# Patient Record
Sex: Female | Born: 1945 | Race: Black or African American | Hispanic: No | Smoking: Never smoker
Health system: Southern US, Community
[De-identification: ages and names within clinical notes are randomized; demographics above are authoritative.]

## PROBLEM LIST (undated history)

## (undated) DIAGNOSIS — I10 Essential (primary) hypertension: Secondary | ICD-10-CM

## (undated) DIAGNOSIS — E785 Hyperlipidemia, unspecified: Secondary | ICD-10-CM

## (undated) HISTORY — DX: Hyperlipidemia, unspecified: E78.5

## (undated) HISTORY — DX: Essential (primary) hypertension: I10

## (undated) HISTORY — PX: HAND SURGERY: SHX662

---

## 1997-12-23 ENCOUNTER — Other Ambulatory Visit: Admission: RE | Admit: 1997-12-23 | Discharge: 1997-12-23 | Payer: Self-pay | Admitting: *Deleted

## 1998-01-28 ENCOUNTER — Ambulatory Visit (HOSPITAL_COMMUNITY): Admission: RE | Admit: 1998-01-28 | Discharge: 1998-01-28 | Payer: Self-pay | Admitting: Family Medicine

## 1998-02-24 ENCOUNTER — Ambulatory Visit (HOSPITAL_COMMUNITY): Admission: RE | Admit: 1998-02-24 | Discharge: 1998-02-24 | Payer: Self-pay | Admitting: Family Medicine

## 1999-03-22 ENCOUNTER — Other Ambulatory Visit: Admission: RE | Admit: 1999-03-22 | Discharge: 1999-03-22 | Payer: Self-pay | Admitting: *Deleted

## 1999-07-11 ENCOUNTER — Other Ambulatory Visit: Admission: RE | Admit: 1999-07-11 | Discharge: 1999-07-11 | Payer: Self-pay | Admitting: *Deleted

## 1999-08-11 ENCOUNTER — Encounter (INDEPENDENT_AMBULATORY_CARE_PROVIDER_SITE_OTHER): Payer: Self-pay | Admitting: Specialist

## 1999-08-11 ENCOUNTER — Other Ambulatory Visit: Admission: RE | Admit: 1999-08-11 | Discharge: 1999-08-11 | Payer: Self-pay | Admitting: *Deleted

## 2000-02-13 ENCOUNTER — Other Ambulatory Visit: Admission: RE | Admit: 2000-02-13 | Discharge: 2000-02-13 | Payer: Self-pay | Admitting: *Deleted

## 2000-03-07 ENCOUNTER — Encounter: Payer: Self-pay | Admitting: *Deleted

## 2000-03-07 ENCOUNTER — Encounter: Admission: RE | Admit: 2000-03-07 | Discharge: 2000-03-07 | Payer: Self-pay | Admitting: *Deleted

## 2000-11-30 ENCOUNTER — Ambulatory Visit (HOSPITAL_BASED_OUTPATIENT_CLINIC_OR_DEPARTMENT_OTHER): Admission: RE | Admit: 2000-11-30 | Discharge: 2000-11-30 | Payer: Self-pay | Admitting: Family Medicine

## 2001-03-11 ENCOUNTER — Encounter: Payer: Self-pay | Admitting: Family Medicine

## 2001-03-11 ENCOUNTER — Ambulatory Visit (HOSPITAL_COMMUNITY): Admission: RE | Admit: 2001-03-11 | Discharge: 2001-03-11 | Payer: Self-pay | Admitting: Family Medicine

## 2001-03-22 ENCOUNTER — Encounter: Admission: RE | Admit: 2001-03-22 | Discharge: 2001-03-22 | Payer: Self-pay | Admitting: Family Medicine

## 2001-03-22 ENCOUNTER — Encounter: Payer: Self-pay | Admitting: Family Medicine

## 2001-10-18 ENCOUNTER — Ambulatory Visit (HOSPITAL_BASED_OUTPATIENT_CLINIC_OR_DEPARTMENT_OTHER): Admission: RE | Admit: 2001-10-18 | Discharge: 2001-10-18 | Payer: Self-pay | Admitting: Family Medicine

## 2002-05-22 ENCOUNTER — Emergency Department (HOSPITAL_COMMUNITY): Admission: EM | Admit: 2002-05-22 | Discharge: 2002-05-22 | Payer: Self-pay | Admitting: Emergency Medicine

## 2002-05-22 ENCOUNTER — Encounter: Payer: Self-pay | Admitting: Emergency Medicine

## 2002-06-29 ENCOUNTER — Emergency Department (HOSPITAL_COMMUNITY): Admission: EM | Admit: 2002-06-29 | Discharge: 2002-06-30 | Payer: Self-pay | Admitting: Emergency Medicine

## 2002-06-29 ENCOUNTER — Encounter: Payer: Self-pay | Admitting: Emergency Medicine

## 2002-08-05 ENCOUNTER — Encounter: Admission: RE | Admit: 2002-08-05 | Discharge: 2002-08-05 | Payer: Self-pay | Admitting: Family Medicine

## 2002-08-05 ENCOUNTER — Encounter: Payer: Self-pay | Admitting: Family Medicine

## 2005-02-03 ENCOUNTER — Ambulatory Visit: Payer: Self-pay | Admitting: Internal Medicine

## 2005-02-06 ENCOUNTER — Ambulatory Visit: Payer: Self-pay | Admitting: Internal Medicine

## 2005-02-16 ENCOUNTER — Ambulatory Visit: Payer: Self-pay | Admitting: *Deleted

## 2006-06-21 ENCOUNTER — Ambulatory Visit: Payer: Self-pay | Admitting: Obstetrics and Gynecology

## 2006-06-21 ENCOUNTER — Encounter: Payer: Self-pay | Admitting: Obstetrics and Gynecology

## 2006-06-26 ENCOUNTER — Ambulatory Visit (HOSPITAL_COMMUNITY): Admission: RE | Admit: 2006-06-26 | Discharge: 2006-06-26 | Payer: Self-pay | Admitting: Obstetrics and Gynecology

## 2008-06-11 ENCOUNTER — Ambulatory Visit (HOSPITAL_COMMUNITY): Admission: RE | Admit: 2008-06-11 | Discharge: 2008-06-11 | Payer: Self-pay | Admitting: Internal Medicine

## 2008-06-11 ENCOUNTER — Encounter: Payer: Self-pay | Admitting: Internal Medicine

## 2008-06-26 ENCOUNTER — Encounter: Admission: RE | Admit: 2008-06-26 | Discharge: 2008-06-26 | Payer: Self-pay | Admitting: Internal Medicine

## 2009-03-02 ENCOUNTER — Encounter: Admission: RE | Admit: 2009-03-02 | Discharge: 2009-05-26 | Payer: Self-pay | Admitting: Family Medicine

## 2009-07-24 HISTORY — PX: OTHER SURGICAL HISTORY: SHX169

## 2009-07-24 HISTORY — PX: COLONOSCOPY: SHX174

## 2009-08-16 ENCOUNTER — Emergency Department (HOSPITAL_COMMUNITY): Admission: EM | Admit: 2009-08-16 | Discharge: 2009-08-16 | Payer: Self-pay | Admitting: Emergency Medicine

## 2009-08-17 ENCOUNTER — Ambulatory Visit: Payer: Self-pay | Admitting: Family Medicine

## 2009-08-17 ENCOUNTER — Observation Stay (HOSPITAL_COMMUNITY): Admission: AD | Admit: 2009-08-17 | Discharge: 2009-08-19 | Payer: Self-pay | Admitting: Family Medicine

## 2009-08-18 ENCOUNTER — Ambulatory Visit: Payer: Self-pay | Admitting: Gastroenterology

## 2009-08-19 ENCOUNTER — Encounter: Payer: Self-pay | Admitting: Gastroenterology

## 2009-08-19 ENCOUNTER — Encounter: Payer: Self-pay | Admitting: Family Medicine

## 2009-08-20 ENCOUNTER — Encounter: Payer: Self-pay | Admitting: Gastroenterology

## 2009-09-14 ENCOUNTER — Ambulatory Visit: Payer: Self-pay | Admitting: Family Medicine

## 2009-09-14 ENCOUNTER — Encounter: Payer: Self-pay | Admitting: Family Medicine

## 2009-09-14 DIAGNOSIS — Z8719 Personal history of other diseases of the digestive system: Secondary | ICD-10-CM | POA: Insufficient documentation

## 2009-09-14 DIAGNOSIS — K621 Rectal polyp: Secondary | ICD-10-CM

## 2009-09-14 DIAGNOSIS — K620 Anal polyp: Secondary | ICD-10-CM | POA: Insufficient documentation

## 2009-09-14 DIAGNOSIS — K219 Gastro-esophageal reflux disease without esophagitis: Secondary | ICD-10-CM | POA: Insufficient documentation

## 2009-09-14 DIAGNOSIS — R7309 Other abnormal glucose: Secondary | ICD-10-CM | POA: Insufficient documentation

## 2009-09-14 DIAGNOSIS — I10 Essential (primary) hypertension: Secondary | ICD-10-CM | POA: Insufficient documentation

## 2009-09-14 LAB — CONVERTED CEMR LAB
Calcium: 9.5 mg/dL (ref 8.4–10.5)
Creatinine, Ser: 0.66 mg/dL (ref 0.40–1.20)
Hgb A1c MFr Bld: 6.1 %
MCHC: 31.5 g/dL (ref 30.0–36.0)
Sodium: 141 meq/L (ref 135–145)

## 2009-09-15 ENCOUNTER — Encounter: Payer: Self-pay | Admitting: Family Medicine

## 2009-10-12 ENCOUNTER — Ambulatory Visit: Payer: Self-pay | Admitting: Family Medicine

## 2009-10-12 ENCOUNTER — Encounter: Payer: Self-pay | Admitting: Family Medicine

## 2009-10-12 LAB — CONVERTED CEMR LAB
ALT: 15 units/L (ref 0–35)
BUN: 11 mg/dL (ref 6–23)
CO2: 25 meq/L (ref 19–32)
Chloride: 105 meq/L (ref 96–112)
Cholesterol: 190 mg/dL (ref 0–200)
Glucose, Bld: 85 mg/dL (ref 70–99)
HDL: 58 mg/dL (ref >39)
LDL Cholesterol: 114 mg/dL — ABNORMAL HIGH (ref 0–99)
Potassium: 3.7 meq/L (ref 3.5–5.3)
Sodium: 141 meq/L (ref 135–145)
Total CHOL/HDL Ratio: 3.3
Triglycerides: 88 mg/dL (ref <150)

## 2009-11-24 ENCOUNTER — Encounter: Payer: Self-pay | Admitting: Family Medicine

## 2009-11-24 ENCOUNTER — Ambulatory Visit: Payer: Self-pay | Admitting: Family Medicine

## 2009-11-24 DIAGNOSIS — E785 Hyperlipidemia, unspecified: Secondary | ICD-10-CM | POA: Insufficient documentation

## 2009-11-24 LAB — CONVERTED CEMR LAB
Hemoglobin: 13.2 g/dL (ref 12.0–15.0)
MCHC: 31.7 g/dL (ref 30.0–36.0)
MCV: 88 fL (ref 78.0–100.0)
RBC: 4.74 M/uL (ref 3.87–5.11)

## 2009-11-25 ENCOUNTER — Encounter: Payer: Self-pay | Admitting: Family Medicine

## 2009-11-28 ENCOUNTER — Encounter: Payer: Self-pay | Admitting: Family Medicine

## 2009-12-07 ENCOUNTER — Telehealth: Payer: Self-pay | Admitting: Family Medicine

## 2010-03-09 ENCOUNTER — Inpatient Hospital Stay (HOSPITAL_COMMUNITY): Admission: EM | Admit: 2010-03-09 | Discharge: 2010-03-12 | Payer: Self-pay | Source: Home / Self Care

## 2010-05-12 ENCOUNTER — Ambulatory Visit: Payer: Self-pay | Admitting: Family Medicine

## 2010-05-12 DIAGNOSIS — I77810 Thoracic aortic ectasia: Secondary | ICD-10-CM | POA: Insufficient documentation

## 2010-05-12 DIAGNOSIS — Z9189 Other specified personal risk factors, not elsewhere classified: Secondary | ICD-10-CM | POA: Insufficient documentation

## 2010-05-24 ENCOUNTER — Encounter: Payer: Self-pay | Admitting: Family Medicine

## 2010-05-24 DIAGNOSIS — T148XXA Other injury of unspecified body region, initial encounter: Secondary | ICD-10-CM | POA: Insufficient documentation

## 2010-05-25 ENCOUNTER — Telehealth: Payer: Self-pay | Admitting: Family Medicine

## 2010-07-11 ENCOUNTER — Encounter (INDEPENDENT_AMBULATORY_CARE_PROVIDER_SITE_OTHER): Payer: Self-pay | Admitting: *Deleted

## 2010-08-01 ENCOUNTER — Ambulatory Visit: Admission: RE | Admit: 2010-08-01 | Discharge: 2010-08-01 | Payer: Self-pay | Source: Home / Self Care

## 2010-08-01 DIAGNOSIS — M545 Low back pain, unspecified: Secondary | ICD-10-CM | POA: Insufficient documentation

## 2010-08-15 ENCOUNTER — Ambulatory Visit: Admission: RE | Admit: 2010-08-15 | Discharge: 2010-08-15 | Payer: Self-pay | Source: Home / Self Care

## 2010-08-15 ENCOUNTER — Encounter: Payer: Self-pay | Admitting: Family Medicine

## 2010-08-15 LAB — CONVERTED CEMR LAB
CO2: 24 meq/L (ref 19–32)
Chloride: 106 meq/L (ref 96–112)
Creatinine, Ser: 0.75 mg/dL (ref 0.40–1.20)
Glucose, Bld: 86 mg/dL (ref 70–99)

## 2010-08-16 ENCOUNTER — Telehealth: Payer: Self-pay | Admitting: *Deleted

## 2010-08-23 ENCOUNTER — Other Ambulatory Visit: Payer: Self-pay | Admitting: General Surgery

## 2010-08-23 DIAGNOSIS — N6310 Unspecified lump in the right breast, unspecified quadrant: Secondary | ICD-10-CM

## 2010-08-25 NOTE — Letter (Signed)
Summary: Patient Notice- Polyp Results  [facility]  [address] [place], [place] [address]   Phone: [phone]  Fax: [phone]        August 20, 2009 MRN: [id number]    [facility] [address] [place], [place]  [address]    Dear Ms. Vicars,  I am pleased to inform you that the colon polyp(s) removed during your recent colonoscopy was (were) found to be benign (no cancer detected) upon pathologic examination. The biopsies showed ischemic colitis.  I recommend you have a repeat colonoscopy examination in 5 years to look for recurrent polyps, as having colon polyps increases your risk for having recurrent polyps or even colon cancer in the future.  Should you develop new or worsening symptoms of abdominal pain, bowel habit changes or bleeding from the rectum or bowels, please schedule an evaluation with either your primary care physician or with me.  Continue treatment plan as outlined the day of your exam.  Please call us if you are having persistent problems or have questions about your condition that have not been fully answered at this time.  Sincerely,  Meryl Dare MD [facility]  This letter has been electronically signed by your physician.  Appended Document: Patient Notice- Polyp Results letter mailed to patient's home
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
## 2010-08-25 NOTE — Miscellaneous (Signed)
Summary: FMLA  Patient left Fmla form to be filled out.  She would like to pick it up by Friday if possible. Bradly Bienenstock  May 24, 2010 4:49 PM  FMLA form placed in Dr. Tyson Alias box for completion.  Terese Door  May 25, 2010 11:11 AM  Formed given to [employer].  Renold Don MD  May 25, 2010 5:06 PM  Pt. called and informed forms are completed and ready to be picked up.  Pt. was very appreciative. Terese Door  May 25, 2010 5:21 PM

## 2010-08-25 NOTE — Letter (Signed)
Summary: Generic Letter  [facility]  [address]   [place], [place] [address]   Phone: [phone]  Fax: [phone]    11/28/2009  [facility] [address] [place], [place]  [address]  Dear Shelly Wheeler,  IT was good to see you in clinic again last week.  The results of your most recent labwork show that your iron levels are good.  We also tested your thyroid function as we discussed and these levels were also normal.  However, your vitamin D level was low.  This could be contributing to your fatigue.  We have a prescription for Vitamin D supplementation to take once weekly for 12 weeks and then once a month for 3 more months.  Please call the clinic and let us know where we can send the prescription. For your records, below are the results of your cholesterol panel:    Triglyceride              88 mg/dL                    <253   HDL Cholesterol           58 mg/dL                    >66   Total Chol/HDL Ratio      3.3 Ratio   VLDL Cholesterol (Calc)                             18 mg/dL                    4-40   LDL Cholesterol (Calc)                        [H]  114 mg/dL                   3-47  As you started you on several more medications for your blood pressure and cholesterol, it would be better to see you in the office in 6 weeks rather than 3 months.  Please call the office to make an appointment at your convenience.    Sincerely,   Renold Don MD  Appended Document: Generic Letter mailed.

## 2010-08-25 NOTE — Assessment & Plan Note (Signed)
Summary: fu/kh   Vital Signs:  Patient profile:   65 year old female Weight:      184.8 pounds Temp:     97.7 degrees F oral Pulse rate:   77 / minute Pulse rhythm:   regular BP sitting:   146 / 101  (left arm) Cuff size:   regular  Vitals Entered By: Loralee Pacas CMA (Nov 24, 2009 3:56 PM)  Primary Care Provider:  Renold Don MD  CC:  follow up visit.  History of Present Illness: CC:  "I'm here to follow up with you after last visit."  1.  Allergic rhinitis:  Patient complaining of increasing nasal drainage for past several months.  She has had a few episodes of ocular itching as well with occasional drainage.  No cough or other respiratory symptoms.  Worsening with change in weather.  Has not been diagnosed with allergies in past.  No hx/o eczema or asthma.  2.  Rash: Patient has student who came to school with poison ivy about 1 week ago.  Has had some itching and red bumps on skin around neck since then.  Using a new type of soap for past month.  Thinks it maight also be from necklaces she wears.  Wants to make sure she doesn't have poison ivy.  3.  HTN:  Pressures elevated this visit, bp 150/100 last visit.  Was given Norvasc at last visit.  Has been taking regularly everyday.  Concerned about pressures and wants to know if HTN contributed to her ischemic colitis and previous hospital admission.  States she is eating a better diet and has started exercising regularly.  Cut down on sodas and tea, but still eats plenty of ice cream 3-5 x weekly.    ROS:  no chest pain, trouble breathing, change in bowel habits, bloody stools, syncope or pre-syncope  Current Problems (verified): 1)  Contact Dermatitis  (ICD-692.9) 2)  Allergic Rhinitis  (ICD-477.9) 3)  Fatigue  (ICD-780.79) 4)  Colonic Polyps, Adenomatous, Benign  (ICD-569.0) 5)  Ischemic Colitis, Hx of  (ICD-V12.79) 6)  Gastroesophageal Reflux Disease  (ICD-530.81) 7)  Hyperglycemia  (ICD-790.29) 8)  Essential Hypertension   (ICD-401.9)  Current Medications (verified): 1)  Amlodipine Besylate 5 Mg Tabs (Amlodipine Besylate) .... Take 1 Daily 2)  Famotidine 20 Mg Tabs (Famotidine) .... Take 1 Daily As Needed For Reflux 3)  Lovastatin 10 Mg Tabs (Lovastatin) .... Take 1 Pill Daily For High Cholesterol 4)  Hydrochlorothiazide 25 Mg Tabs (Hydrochlorothiazide) .... Take 1 Pill Daily For High Blood Pressure  Allergies (verified): No Known Drug Allergies  Past History:  Past medical, surgical, family and social histories (including risk factors) reviewed, and no changes noted (except as noted below).  Past Medical History: Reviewed history from 09/14/2009 and no changes required. Sees a nutritionist on recommendation from Urgent Care physician after being diagnosed "pre-diabetic" Hospitalized Jan 2010 for BRBPR - found to have ischemic colitis on colonscopy Adenomatous polyps removed Jan 2010 - repeat colonscope in 5 years  Past Surgical History: Reviewed history from 09/14/2009 and no changes required. denies  Family History: Reviewed history from 09/14/2009 and no changes required. Father:prostate CA Brother:  Bone CA Mother:  HTN Brother:  HTN Aunt, brother:  Diabetes Aunt with breast CA.  No history GI CA  Social History: Reviewed history from 09/14/2009 and no changes required. 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  Divorced 1980. Starting a walking  exercise program.      Smoking:  never EtOH:  none Drugs of abuse:  none  Review of Systems       see HPI  Physical Exam  General:  Vital signs reviewed Well-developed, well-nourished patient in NAD.  Awake, cooperative.  Eyes:  pupils equal, pupils round, and pupils reactive to light.   Mouth:  oral mucosa moist Neck:  no goiter Lungs:  clear to auscultation without wheezing Heart:  RRR without murmurs Abdomen:  soft/NT/ND, normal bowel sounds Pulses:  distal  pulses palpable BL extremities Extremities:  no edema noted  Skin:  very small, <1 mm, erythematous, papules scattered across upper chest in U-shaped pattern around neck.  No vesicles noted.     Impression & Recommendations:  Problem # 1:  ALLERGIC RHINITIS (ICD-477.9) Assessment New Feel this is most likely allergic rhinitis.  No signs or symptom of upper respiratory illness, just rhinitis aggravated by changes in weather.  Discussed treatment options with patient, plan to try OTC Claritin or Allegra at this time.  Pt may need inhaled nasal steroid in future, but for now will try OTC H-blocker.   Orders: [facility]- Est  Level 4 ([address])  Problem # 2:  CONTACT DERMATITIS (ICD-692.9) Assessment: New Patient does not have poison ivy.  Feel this is most likely contact dermatitis.  Erythematous areas conforms to possible necklace or sweater pattern. Does endorse history of starting new soap recently, cheaper brand.  Recommended she switch to another or previous brand.  H1-blocker from above may also help with this.  Recommended trying Benadryl for relief and also not wearing necklaces for a while to see if that caused dermatitis.   Orders: [facility]- Est  Level 4 ([address]29)  Problem # 3:  ESSENTIAL HYPERTENSION (ICD-401.9) Assessment: Improved Added HCTZ to regimen.  Patient still hypertensive, but improved.  Recommended she continue with diet and exercise changes.  Will continue to follow blood pressure.  Follow-up in 3 months to recheck  Her updated medication list for this problem includes:    Amlodipine Besylate 5 Mg Tabs (Amlodipine besylate) .Marland Kitchen... Take 1 daily    Hydrochlorothiazide 25 Mg Tabs (Hydrochlorothiazide) .Marland Kitchen... Take 1 pill daily for high blood pressure  Problem # 4:  HYPERLIPIDEMIA (ICD-272.4) Plan to start patient on Lovastatin 10 mg.  History of cardiomyopathy per patient.  Do not have records of this as from outside hospital.  As most common cause of cardiomyopathy in [place] is ischemic  cardiomyopathy, so most likely patient has atherosclerosis.  Will try and get patient's lipids under 100.  Plan to check CMET after starting statin.   Her updated medication list for this problem includes:    Lovastatin 10 Mg Tabs (Lovastatin) .Marland Kitchen... Take 1 pill daily for high cholesterol  Problem # 5:  FATIGUE (ICD-780.79) Patient complained of fatigue.  Will check CBC, TSH, Vit D.  Also check CBC to make sure patient not losing any more blood.  Denies any melena or hematochezia.   Orders: Vit D, 25 [facility] [phone]) CBC-[facility] ([address]) TSH-[facility] [phone]) [facility]- Est  Level 4 ([address])  Complete Medication List: 1)  Amlodipine Besylate 5 Mg Tabs (Amlodipine besylate) .... Take 1 daily 2)  Famotidine 20 Mg Tabs (Famotidine) .... Take 1 daily as needed for reflux 3)  Lovastatin 10 Mg Tabs (Lovastatin) .... Take 1 pill daily for high cholesterol 4)  Hydrochlorothiazide 25 Mg Tabs (Hydrochlorothiazide) .... Take 1 pill daily for high blood pressure  Patient Instructions: 1)  Come back and see me  in 3 months.  We will recheck your blood sugars at that time to see how the diet and exercise worked. 2)  For blood pressure, we will add HCTZ 25 mg 1 pill daily.  We will recheck your blood pressures in 3 months as well. 3)  Your colonoscopy results showed two things:  1) ischemic colitis - this means you had some inflammation in your large intestine caused by decreased blood flow.  This is something to keep an eye on but not really to worry about.  2)  Polyps - these are small growths in your colon.  You will need a follow-up colonscopy in 5 years.  These growths were benign. 4)  We will get a few more lab tests today to check reasons for fatigue.  I will send you the results. 5)  For sinus drainage, try Loratadine (Claritin) or Fexofenadine (Allegra) whichever is cheaper.  This should help with the allergies. Prescriptions: HYDROCHLOROTHIAZIDE 25 MG TABS (HYDROCHLOROTHIAZIDE) Take 1 pill daily for high  blood pressure  #31 x 3   Entered and Authorized by:   Renold Don MD   Signed by:   Renold Don MD on 11/24/2009   Method used:   Electronically to        [facility] # [phone]* (retail)       [address] [place], [place]  [address]       Ph: [phone]       Fax: [phone]   RxID:   [id number] LOVASTATIN 10 MG TABS (LOVASTATIN) Take 1 pill daily for high cholesterol  #31 x 3   Entered and Authorized by:   Renold Don MD   Signed by:   Renold Don MD on 11/24/2009   Method used:   Electronically to        [facility] # [phone]* (retail)       [address] [place], [place]  [address]       Ph: [phone]       Fax: [phone]   RxID:   [id number]

## 2010-08-25 NOTE — Miscellaneous (Signed)
  Clinical Lists Changes  Medications: Added new medication of VITAMIN D (ERGOCALCIFEROL) 50000 UNIT CAPS (ERGOCALCIFEROL) Take 1 pill each week for 12 weeks, then take 1 pill a month for 3 more months - Signed Rx of VITAMIN D (ERGOCALCIFEROL) 50000 UNIT CAPS (ERGOCALCIFEROL) Take 1 pill each week for 12 weeks, then take 1 pill a month for 3 more months;  #15 x 0;  Signed;  Entered by: Renold Don MD;  Authorized by: Renold Don MD;  Method used: Electronically to [facility]*, [address] [place], [place], [place]  [address], Ph: [phone], Fax: [phone]    Prescriptions: VITAMIN D (ERGOCALCIFEROL) 50000 UNIT CAPS (ERGOCALCIFEROL) Take 1 pill each week for 12 weeks, then take 1 pill a month for 3 more months  #15 x 0   Entered and Authorized by:   Renold Don MD   Signed by:   Renold Don MD on 12/05/2009   Method used:   Electronically to        [facility]* (retail)       [address] [place], [place]  [address]       Ph: [phone]       Fax: [phone]   RxID:   [id number]

## 2010-08-25 NOTE — Letter (Signed)
Summary: Generic Letter  [facility]  [address]   [place], [place] [address]   Phone: [phone]  Fax: [phone]    09/15/2009  [facility] [address] [place], [place]  [address]  Dear Ms. Capps,  It was good to see you in clinic and we welcome you to our practice.  The bloodwork which we completed at your last visit all was normal.  We were specifically looking at your hemoglobin to make sure you did not have any microscopic bleeding and at your potassium levels.  Both of these were fine.  Your kidney function also was good, which is something we check in people who have a history of high blood pressure.  We will see you again at your next visit.    Sincerely,   Renold Don MD

## 2010-08-25 NOTE — Progress Notes (Signed)
Summary: Rx Req  Phone Note Call from Patient Call back at Home Phone [phone]   Caller: Patient Summary of Call: Pt wants the rx for Vitamin D to go to [facility]. Initial call taken by: Clydell Hakim,  Dec 07, 2009 3:41 PM    Prescriptions: VITAMIN D (ERGOCALCIFEROL) 50000 UNIT CAPS (ERGOCALCIFEROL) Take 1 pill each week for 12 weeks, then take 1 pill a month for 3 more months  #15 x 0   Entered and Authorized by:   Renold Don MD   Signed by:   Renold Don MD on 12/07/2009   Method used:   Electronically to        [facility] # [phone]* (retail)       [address] [place], [place]  [address]       Ph: [phone]       Fax: [phone]   RxID:   [id number]   Appended Document: Rx Req Sent in to [facility].  THanks!

## 2010-08-25 NOTE — Assessment & Plan Note (Signed)
Summary: NP, tcb   Vital Signs:  Patient profile:   65 year old female Height:      64.5 inches Weight:      186.6 pounds BMI:     31.65 Pulse rate:   74 / minute BP sitting:   150 / 100  (right arm) Cuff size:   large  Vitals Entered By: Arlyss Repress CMA, (September 14, 2009 8:56 AM) CC: new pt. hospital f/up. did not take her HTN meds. started again last night. re-checked BP 152/102 Pain Assessment Patient in pain? no        Primary Care Provider:  Renold Don MD  CC:  new pt. hospital f/up. did not take her HTN meds. started again last night. re-checked BP 152/102.  History of Present Illness: CC:  hospital f/u  Problems:  1.  Hospitalization for BRBPR:  no more symptoms.  Colonoscopy in-house showed ischemic colitis and benign polyps, which were removed.  No nausea or vomiting.  Pt received letter regarding results of colonscopy, did not really understand what letter said.  2.  HTN:  blood pressures 150s systolic x 2 in clinic.  Pt has been out of blood pressure medication for past week.  Just restarted last night.  HCTZ D/C'ed in house secondary to hypokalemia.  States she has felt a little more tired than usual, but denies any other symptoms.    3.  GERD:  relieved with Famotidine.  She takes this once daily before breakfast.  Unsure what happens if she doesn't take medicine as she has been taking it every day.  Unsure exactly what triggers her GERD symptoms.  No hematemesis.      ROS:  no pre-syncope or syncopal episodes.  No headaches or vision changes.  No abdominal pain.  No edema.  No rashes  Habits & Providers  Alcohol-Tobacco-Diet     Tobacco Status: never  Current Problems (verified): 1)  Gastroesophageal Reflux Disease  (ICD-530.81) 2)  Hyperglycemia  (ICD-790.29) 3)  Essential Hypertension  (ICD-401.9)  Current Medications (verified): 1)  Amlodipine Besylate 5 Mg Tabs (Amlodipine Besylate) .... Take 1 Daily 2)  Metformin Hcl 500 Mg Tabs (Metformin  Hcl) .... Take 1 Daily 3)  Famotidine 20 Mg Tabs (Famotidine) .... Take 1 Daily As Needed For Reflux  Allergies (verified): No Known Drug Allergies  Past History:  Past Medical History: Sees a nutritionist on recommendation from Urgent Care physician after being diagnosed "pre-diabetic" Hospitalized Jan 2010 for BRBPR - found to have ischemic colitis on colonscopy Adenomatous polyps removed Jan 2010 - repeat colonscope in 5 years  Past Surgical History: denies  Family History: Father:prostate CA Brother:  Bone CA Mother:  HTN Brother:  HTN Aunt, brother:  Diabetes Aunt with breast CA.  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 Starting a walking exercise program.      Smoking:  never EtOH:  none Drugs of abuse:  noneSmoking Status:  never  Physical Exam  General:  Vital signs reviewed Gen Eyes:  No corneal or conjunctival inflammation noted. EOMI. Perrla. Funduscopic exam benign, without hemorrhages, exudates or papilledema. Vision grossly normal. Ears:  External ear exam shows no significant lesions or deformities.  Otoscopic examination reveals clear canals, tympanic membranes are intact bilaterally without bulging, retraction, inflammation or discharge. Hearing is grossly normal bilaterally. Mouth:  oral mucosa moist, tonsils non-erythematous Neck:  no lymphadenopathy Lungs:  clear to auscultation without wheezing Heart:  RRR without murmurs Abdomen:  soft/NT/ND, normal bowel sounds Pulses:  distal pulses palpable BL extremities Extremities:  no edema noted    Impression & Recommendations:  Problem # 1:  ISCHEMIC COLITIS, HX OF (ICD-V12.79) No further symptoms.  Not on any blood thinners.  Followed by [facility].  Will continue with supportive management as pt is asymptomatic.  Problem # 2:  COLONIC POLYPS, ADENOMATOUS, BENIGN (ICD-569.0) Needs  f/u colonoscope in 5 years.  Previously performed by [facility].  Removed 2 polyps in total.  Will follow GI rec's.    Problem # 3:  ESSENTIAL HYPERTENSION (ICD-401.9) Pt previously on HCTZ 25 mg as well as Amlodipine 5 mg.  Stopped at last hospitalization secondary to hypokalemia.  Pressures 150s/100s x 2 measurements in clinic today.  However, she has been out of her medications for past week.  Plan to allow 1 month on Norvasc, then return to clinic for recheck.  Will check BMET today.  May need to restart HCTZ next visit depending on potassium level as well as blood pressures at next visit.   Her updated medication list for this problem includes:    Amlodipine Besylate 5 Mg Tabs (Amlodipine besylate) .Marland Kitchen... Take 1 daily  Orders: Basic Met-[facility] ([address]) CBC-[facility] ([address])  Problem # 4:  HYPERGLYCEMIA (ICD-790.29) Diagnosed with "pre-diabetes" at previous PCP.  Started on Metformin by previous PCP.  Plan to check A1C today as well as random glucose on BMET.  Depending on results, may need to stop or increase Metformin.  Should also check CMET for liver status at next visit if she's going to stay on Metformin.   Her updated medication list for this problem includes:    Metformin Hcl 500 Mg Tabs (Metformin hcl) .Marland Kitchen... Take 1 daily  Orders: A1C-[facility] ([address])  Problem # 5:  GASTROESOPHAGEAL REFLUX DISEASE (ICD-530.81) Pt was on PPI prior to being seen by GI.  They stopped PPI in house and started her on Famotidine.  Discussed with patient that we can move H2 blocker to as needed basis.  Will follow-up with tolerance of this at next visit.  Now that BRBPR has stopped, can restart PPI if necessary.   Her updated medication list for this problem includes:    Famotidine 20 Mg Tabs (Famotidine) .Marland Kitchen... Take 1 daily as needed for reflux  Complete Medication List: 1)  Amlodipine Besylate 5 Mg Tabs (Amlodipine besylate) .... Take 1 daily 2)  Metformin Hcl 500 Mg Tabs (Metformin hcl) .... Take 1 daily 3)   Famotidine 20 Mg Tabs (Famotidine) .... Take 1 daily as needed for reflux  Patient Instructions: 1)  Come back and see me in 1 month so we can recheck your blood pressure.  Keep taking the Amlodipine for your blood pressure. 2)  On your way out, set up a lab appt to have your cholesterol checked at your convenience.  You cannot eat for 8 hours beforehand. 3)  Your colonoscopy results showed two things:  1) ischemic colitis - this means you had some inflammation in your large intestine caused by decreased blood flow.  This is something to keep an eye on but not really to worry about.  2)  Polyps - these are small growths in your colon.  You will need a follow-up colonscopy in 5 years.  These growths were benign. 4)  Take the Famotidine (acid reducing medicine) on an as needed basis for reflux. 5)  We will get a few lab tests  today.  At your next appt, we will go over these and see about starting another blood pressure medicine.   Prescriptions: FAMOTIDINE 20 MG TABS (FAMOTIDINE) Take 1 daily as needed for reflux  #30 x 3   Entered and Authorized by:   Renold Don MD   Signed by:   Bonnie Swaziland on 09/14/2009   Method used:   Electronically to        [facility] # [phone]* (retail)       [address] [place], [place]  [address]       Ph: [phone]       Fax: [phone]   RxID:   [id number] METFORMIN HCL 500 MG TABS (METFORMIN HCL) Take 1 daily  #30 x 1   Entered and Authorized by:   Renold Don MD   Signed by:   Bonnie Swaziland on 09/14/2009   Method used:   Electronically to        [facility] # [phone]* (retail)       [address] [place], [place]  [address]       Ph: [phone]       Fax: [phone]   RxID:   [id number] AMLODIPINE BESYLATE 5 MG TABS (AMLODIPINE BESYLATE) Take 1 daily  #30 x 3   Entered and Authorized by:   Renold Don MD   Signed by:   Bonnie Swaziland on 09/14/2009   Method used:   Electronically to        [facility] #  [phone]* (retail)       [address] [place], [place]  [address]       Ph: [phone]       Fax: [phone]   RxID:   [id number]    Flowsheet View for Follow-up Visit    Weight:     186.6    Blood pressure:   150 / 100   Laboratory Results   Blood Tests   Date/Time Received: September 14, 2009 9:45 AM  Date/Time Reported: September 14, 2009 11:08 AM   HGBA1C: 6.1%   (Normal Range: Non-Diabetic - 3-6%   Control Diabetic - 6-8%)  Comments: ...............test performed by......Marland KitchenBonnie A. Swaziland, MLS (ASCP)cm

## 2010-08-25 NOTE — Miscellaneous (Signed)
Summary: sent medical records   Clinical Lists Changes Send medical records to [employer] 02/2010 to present. [employer]

## 2010-08-25 NOTE — Procedures (Signed)
Summary: Colonoscopy  Patient: [name] Note: All result statuses are Final unless otherwise noted.  Tests: (1) Colonoscopy (COL)   COL Colonoscopy           DONE     [facility]     [address]     [place], [place]  [address]           COLONOSCOPY PROCEDURE REPORT           PATIENT:  Shelly, Wheeler  MR#:  [id number]     BIRTHDATE:  03-22-1946, 63 yrs. old  GENDER:  female           ENDOSCOPIST:  Judie Petit T. Russella Dar, MD, [facility]     Referred by:  Family Medicine Teaching Service           PROCEDURE DATE:  08/19/2009     PROCEDURE:  Colonoscopy with biopsy and snare polypectomy     ASA CLASS:  Class II     INDICATIONS:  1) hematochezia  2) abnormal CT of abdomen           MEDICATIONS:   Fentanyl 100 mcg IV, Versed 8 mg IV           DESCRIPTION OF PROCEDURE:   After the risks benefits and     alternatives of the procedure were thoroughly explained, informed     consent was obtained.  Digital rectal exam was performed and     revealed no abnormalities.   The [id number] ([id number]) endoscope was     introduced through the anus and advanced to the cecum, which was     identified by both the appendix and ileocecal valve, without     limitations.  The quality of the prep was good, using MiraLax.     The instrument was then slowly withdrawn as the colon was fully     examined.     <<PROCEDUREIMAGES>>           FINDINGS:  Colitis was found in the descending colon. It was     segmental, erosive, edematous and circumferential. Multiple     biopsies were obtained and sent to pathology.  Two polyps were     found in the ascending colon. They were 4 - 5 mm in size. Polyps     were snared without cautery. Retrieval was successful. This was     otherwise a normal examination of the colon. Retroflexed views in     the rectum revealed no abnormalities.  The time to cecum =  2     minutes. The scope was then withdrawn (time =  12  min) from the     patient and the procedure  completed.           COMPLICATIONS:  None           ENDOSCOPIC IMPRESSION:     1) Segmental colitis in the descending colon     2) 4 - 5 mm Two polyps in the ascending colon           RECOMMENDATIONS:     1) Await pathology results     2) If the polyps removed today are adenomatous (pre-cancerous),     you will need a repeat colonoscopy in 5 years. Otherwise you     should continue to follow colorectal cancer screening guidelines     for "routine risk" patients with colonoscopy in 10 years.     Venita Lick. Russella Dar, MD, [facility]  CC:  Tracey Harries, MD           n.     Rosalie DoctorVenita Lick. Sarahelizabeth Conway at 08/19/2009 01:32 PM           Stacie Glaze, [id number]  Note: An exclamation mark (!) indicates a result that was not dispersed into the flowsheet. Document Creation Date: 08/19/2009 1:33 PM _______________________________________________________________________  (1) Order result status: Final Collection or observation date-time: 08/19/2009 13:26 Requested date-time:  Receipt date-time:  Reported date-time:  Referring Physician:   Ordering Physician: Claudette Head [id number]) Specimen Source:  Source: Launa Grill Order Number: [id number] Lab site:

## 2010-08-25 NOTE — Progress Notes (Signed)
Summary: phn msg  Phone Note From Other Clinic   Caller: [facility]janice Summary of Call: appt sched for 08/23/10 @ 1045 w/ Jimmye Norman Initial call taken by: De Nurse,  August 16, 2010 2:30 PM  Follow-up for Phone Call        lvm for pt Follow-up by: Loralee Pacas CMA,  August 17, 2010 3:28 PM

## 2010-08-25 NOTE — Progress Notes (Signed)
  Phone Note Call from Patient   Caller: Patient Call For: [phone] Summary of Call: Left forms for physician to complete for employer.  Need to turn them back in to the benefits dept by Friday.  Please call and let me know when they can be picked up. Initial call taken by: Abundio Miu,  May 25, 2010 3:14 PM  Follow-up for Phone Call        Form given to Terese Door today.    Call patient and informed her FMLA forms were completed and ready to be picked up...Marland KitchenMarland KitchenMarland Kitchen Terese Door  May 25, 2010 5:19 PM  Follow-up by: Renold Don MD,  May 25, 2010 5:06 PM

## 2010-08-31 ENCOUNTER — Ambulatory Visit
Admission: RE | Admit: 2010-08-31 | Discharge: 2010-08-31 | Disposition: A | Discharge status: Home / Self Care | Payer: BC Managed Care – PPO | Source: Ambulatory Visit | Attending: General Surgery | Admitting: General Surgery

## 2010-08-31 DIAGNOSIS — N6310 Unspecified lump in the right breast, unspecified quadrant: Secondary | ICD-10-CM

## 2010-09-02 ENCOUNTER — Encounter: Payer: Self-pay | Admitting: *Deleted

## 2010-09-12 ENCOUNTER — Ambulatory Visit (INDEPENDENT_AMBULATORY_CARE_PROVIDER_SITE_OTHER): Payer: BC Managed Care – PPO | Admitting: Family Medicine

## 2010-09-12 ENCOUNTER — Encounter: Payer: Self-pay | Admitting: Family Medicine

## 2010-09-12 VITALS — BP 150/90 | Temp 98.0°F | Ht 64.5 in | Wt 196.0 lb

## 2010-09-12 DIAGNOSIS — I10 Essential (primary) hypertension: Secondary | ICD-10-CM

## 2010-09-12 DIAGNOSIS — J329 Chronic sinusitis, unspecified: Secondary | ICD-10-CM

## 2010-09-12 NOTE — Assessment & Plan Note (Addendum)
Symptoms of congestion and headache with some mild facial tenderness on exam.  Likely viral sinusitis.  Pt has not been taking adequate analgesics (took ibuprofen 200mg ).  Gave correct dose to help with headache (400-600mg ), pt may continue with Coricidin since it has been helping her symptoms.  Red flags given for rtc (Fever >102 that does not respond to antipyrectic, intractable vomiting).  Pt to rtc in 1 wk if symptoms still present and may need antibiotic at that time.  Pt agreeable to plan.

## 2010-09-12 NOTE — Patient Instructions (Signed)
Your symptoms may a week to resolve.  If you are still feeling bad after 7-10 days of symptoms, then come back to clinic.  You may need antibiotic at that time.  You can take Ibuprofen/Advil 400mg  -600mg  (2-3 tabs) 4 times per day for pain or fever or headache.    Sinusitis   Sinuses are air pockets in your face. Sinusitis is an infection in your sinuses. The growth of bacteria in a sinus can lead to infection. The infection can cause swelling from your sinuses.  The infection can cause drainage from your sinuses. Sinusitis can be in one or more of your sinuses.   HOME CARE    Only take medicines for pain, discomfort, or fever as directed by your doctor.  Drink enough water and fluids to keep your pee clear or pale yellow.  Apply either moist heat or ice packs for pain.  Use saline nasal sprays. These will moisten thick fluid in your nose. This can help your sinuses drain. The sprays can be found at a drugstore.   TREATMENT   After an exam, your doctor may give you:   Medicine (antibiotics) that kills the infection.   Medicine (decongestant) to shrink swelling.   GET HELP RIGHT AWAY IF:    You have or your child has a temperature by mouth above 102 F (38.9 C).  You have pain that is getting worse.  You get a very bad headache.  You are throwing up over and over.  You develop swelling on the face.   MAKE SURE YOU:     Understand these instructions.   Will watch your condition.  Will get help right away if you are not doing well or get worse.   Document Released: 12/27/2007  Document Re-Released: 08/01/2009 [facility] Patient Information 2011 [place], [place].

## 2010-09-12 NOTE — Assessment & Plan Note (Signed)
Comment only.  BP elevated today 150/90. Pt states she is taking her meds are directed by Dr Gwendolyn Grant.  Elevation may be 2/2 current sick symptoms (sinusitis).  Advised pt to rtc to see Dr Gwendolyn Grant to address BP.

## 2010-09-12 NOTE — Progress Notes (Signed)
  Subjective:    Patient ID: Shelly Wheeler, female    DOB: 1945/12/05, 65 y.o.   MRN: [id number]  HPI 65 y/o F here for "head congestion" that started on Thurs (2/16) and worsened on Fri (2/17).  She has been taking Coricidin since Saturday (2/18), which seems to help cough.  She complaints of headache that is "achy" and worse on Left side of head.  She used to have migraine headache years ago, but it stopped, but this does not feel like it.  She has taken Advil (200mg ) this morning, which helped a little.  Headache is located in frontal area, above both eyes.     Review of Systems + nasal congestion, +rhinorrhe (which has resolved), subjective fever on Sat and Sun and early this morning, no ear pain/pressure, no nausea/vomiting, no vision change, no watery eyes,no dizziness, no chest pain, no dyspnea, no palpitations, no LE edema.  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    Objective:   Physical Exam GEN: NAD, appropriate and cooperative throughout exam HEENT: MMM, EOMI, no neck nodes or masses. Tenderness to maxillary sinuses. CVS: RRR, no murmurs RESP: CTA b/l no wheezing/rales ABD: NT/ND EXT: no edema       Assessment & Plan:

## 2010-09-20 NOTE — Letter (Signed)
Summary: Out of Work  [facility]  [address]   [place], [place] [address]   Phone: [phone]  Fax: [phone]    September 12, 2010   Employee:  [employer]    To Whom It May Concern:   For Medical reasons, please excuse the above named employee from work for the following dates:  Start:   Sep 12, 2010  End:   May return to work Sep 13, 2010  If you need additional information, please feel free to contact our office.         Sincerely,    Blake Vetrano MD

## 2010-10-01 ENCOUNTER — Other Ambulatory Visit: Payer: Self-pay | Admitting: Family Medicine

## 2010-10-01 DIAGNOSIS — I10 Essential (primary) hypertension: Secondary | ICD-10-CM

## 2010-10-02 NOTE — Telephone Encounter (Signed)
Refill request

## 2010-10-06 ENCOUNTER — Encounter: Payer: Self-pay | Admitting: Family Medicine

## 2010-10-06 ENCOUNTER — Ambulatory Visit (INDEPENDENT_AMBULATORY_CARE_PROVIDER_SITE_OTHER): Payer: BC Managed Care – PPO | Admitting: Family Medicine

## 2010-10-06 DIAGNOSIS — I10 Essential (primary) hypertension: Secondary | ICD-10-CM

## 2010-10-06 DIAGNOSIS — G5700 Lesion of sciatic nerve, unspecified lower limb: Secondary | ICD-10-CM

## 2010-10-06 DIAGNOSIS — Z9189 Other specified personal risk factors, not elsewhere classified: Secondary | ICD-10-CM

## 2010-10-06 MED ORDER — LISINOPRIL-HYDROCHLOROTHIAZIDE 10-12.5 MG PO TABS: 1.0000 | ORAL_TABLET | Freq: Every day | ORAL | Status: DC

## 2010-10-06 NOTE — Patient Instructions (Addendum)
Make an appointment to come back and see me in 1 month.  We will check your labs at that visit.  If you start having a lot of swelling in your legs, headaches, or other concerns call our office.    Piriformis Syndrome with Rehab   Piriformis syndrome is a condition the affects the nervous system in the area of the hip, and is characterized by pain and possibly a loss of feeling in the backside (posterior) thigh that may extend down the entire length of the leg. The symptoms are caused by an increase in pressure on the sciatic nerve by the piriformis muscle, which is on the back of the hip and is responsible for externally rotating the hip. The sciatic nerve and its branches connect to much of the leg. Normally the sciatic nerve runs between the piriformis muscle and other muscles. However, in certain individuals the nerve runs through the muscle, which causes an increase in pressure on the nerve and results in the symptoms of piriformis syndrome.   SYMPTOMS  Pain, tingling, numbness, or burning in the back of the thigh that may also extend down the entire leg.  Occasionally, tenderness in the buttock.  Loss of function of the leg.  Pain that worsens when using the piriformis muscle (running, jumping, or stairs).  Pain that increases with prolonged sitting.  Pain that is lessened by laying flat on the back.   CAUSES  Piriformis syndrome is the result of an increase in pressure placed on the sciatic nerve. Often times piriformis syndrome is an overuse injury.    Stress placed on the nerve from a sudden increase in the intensity, frequency, or duration of training.  Compensation of other extremity injuries.     RISK INCREASES WITH    Sports that involve the piriformis muscle (running, walking or jumping).  You are born with (congenital) a defect in which the sciatic nerve passes through the muscle.   PREVENTIVE MEASURES    Warm up and stretch properly before activity.  Allow for  adequate recovery between workouts.  Maintain physical fitness: l Strength, flexibility, and endurance. l Cardiovascular fitness.   PROGNOSIS If treated properly, then the symptoms of piriformis syndrome usually resolve in 2 to 6 weeks.   POSSIBLE COMPLICATIONS    Persistent and possibly permanent pain and numbness in the lower extremity.  Weakness of the extremity that may progress to disability and inability to compete.   GENERAL TREATMENT CONSIDERATIONS   The most effective treatment for piriformis syndrome is rest from any activities that aggravate the symptoms.  Ice and pain medication may help reduce pain and inflammation. The use of strengthening and stretching exercises may help reduce pain with activity. These exercises may be performed at home or with a therapist.  A referral to a therapist may be given for further evaluation and treatment, such as ultrasound. Corticosteroid injections may be given to reduce inflammation that is causing pressure to be placed on the sciatic nerve. If  non-surgical (conservative) treatment is unsuccessful, then surgery may be recommended.     MEDICATION    If pain medication is necessary, then nonsteroidal anti-inflammatory medications, such as aspirin and ibuprofen, or other minor pain relievers, such as acetaminophen, are often recommended.    Do not take pain medication for 7 days before surgery.    Prescription pain relievers may be given if deemed necessary by your caregiver. Use only as directed and only as much as you need.  Corticosteroid injections may be  given by your caregiver. These injections should be reserved for the most serious cases, because they may only be given a certain number of times.   HEAT AND COLD:    Cold treatment (icing) relieves pain and reduces inflammation. Cold treatment should be applied for 10 to 15 minutes every 2 to 3 hours for inflammation and pain and immediately after any activity that aggravates your  symptoms. Use ice packs or massage the area with a piece of ice (ice massage).  Heat treatment may be used prior to performing the stretching and strengthening activities prescribed by your caregiver, physical therapist, or athletic trainer. Use a heat pack or soak the injury in warm water.   SEEK TREATMENT IF:  Treatment seems to offer no benefit, or the condition worsens.  Any medications produce adverse side effects.     EXERCISES   RANGE OF MOTION AND STRETCHING EXERCISES - Piriformis Syndrome These exercises may help you when beginning to rehabilitate your injury. Your symptoms may resolve with or without further involvement from your physician, physical therapist or athletic trainer. While completing these exercises, remember:      Restoring tissue flexibility helps normal motion to return to the joints. This allows healthier, less painful movement and activity.  An effective stretch should be held for at least 30 seconds.  A stretch should never be painful. You should only feel a gentle lengthening or release in the stretched tissue.      STRETCH - Hip Rotators   Lie on your back on a firm surface. Grasp your __________ knee with your __________ hand and your ankle with your opposite hand.    Keeping your hips and shoulders firmly planted, gently pull your __________ knee and rotate your lower leg toward your opposite shoulder until you feel a stretch in your buttocks.    Hold this stretch for __________ seconds. Repeat this stretch __________ times. Complete this stretch __________ times per day.     STRETCH - Iliotibial Band    On the floor or bed, lie on your side so your __________ leg is on top. Bend your knee and grab your ankle.    Slowly bring your knee back so that your thigh is in line with your trunk.  Keep your heel at your buttocks and gently arch your back so your head, shoulders and hips line up.    Slowly lower your leg so that your knee approaches the  floor/bed until you feel a gentle stretch on the outside of your __________ thigh. If you do not feel a stretch and your knee will not fall farther, place the heel of your opposite foot on top of your knee and pull your thigh down farther.  Hold this stretch for __________ seconds.   Repeat __________ times. Complete __________ times per day.     STRENGTHENING EXERCISES - Piriformis Syndrome  These are some of the caregiver again or until your symptoms are resolved. Remember:    Strong muscles with good endurance tolerate stress better.  Do the exercises as initially prescribed by your caregiver. Progress slowly with each exercise, gradually increasing the number of repetitions and weight used under their guidance.    STRENGTH - Hip Abductors, Straight Leg Raises  Be aware of your form throughout the entire exercise so that you exercise the correct muscles. Sloppy form means that you are not strengthening the correct muscles.  Lie on your side so that your head, shoulders, knee and hip line up. You may bend  your lower knee to help maintain your balance.  Your __________ leg should be on top.  Roll your hips slightly forward, so that your hips are stacked directly over each other and your __________ knee is facing forward.  Lift your top leg up 4-6 inches, leading with your heel. Be sure that your foot does not drift forward or that your knee does not roll toward the ceiling.    Hold this position for __________ seconds. You should feel the muscles in your outer hip lifting (you may not notice this until your leg begins to tire).    Slowly lower your leg to the starting position.  Allow the muscles to fully relax before beginning the next repetition.   Repeat __________ times. Complete this exercise __________ times per day.       STRENGTH - Hip Abductors, Quadriped  On a firm, lightly padded surface, position yourself on your hands and knees.  Your hands should be directly below your  shoulders and your knees should be directly below your hips.  Keeping your __________ knee bent, lift your leg out to the side. Keep your legs level and in line with your shoulders.  Position yourself on your hands and knees.  Hold for __________ seconds.    Keeping your trunk steady and your hips level, slowly lower your leg to the starting position.   Repeat __________ times. Complete this exercise __________ times per day.      STRENGTH - Hip Abductors, Standing  Tie one end of a rubber exercise band/tubing to a secure surface (table, pole) and tie a loop at the other end.  Place the loop around your __________ ankle. Keeping your ankle with the band directly opposite of the secured end, step away until there is tension in the tube/band.    Hold onto a chair as needed for balance.  Keeping your back upright, your shoulders over your hips, and your toes pointing forward, lift your __________ leg out to your side. Be sure to lift your leg with your hip muscles.  Do not "throw" your leg or tip your body to lift your leg.  Slowly and with control, return to the starting position.   Repeat exercise __________ times. Complete this exercise __________ times per day.     Document Released: 07/10/2005  Document Re-Released: 08/01/2009 [facility] Patient Information 2011 [place], [place].

## 2010-10-07 LAB — GLUCOSE, CAPILLARY
Glucose-Capillary: 100 mg/dL — ABNORMAL HIGH (ref 70–99)
Glucose-Capillary: 103 mg/dL — ABNORMAL HIGH (ref 70–99)
Glucose-Capillary: 111 mg/dL — ABNORMAL HIGH (ref 70–99)
Glucose-Capillary: 112 mg/dL — ABNORMAL HIGH (ref 70–99)
Glucose-Capillary: 126 mg/dL — ABNORMAL HIGH (ref 70–99)
Glucose-Capillary: 130 mg/dL — ABNORMAL HIGH (ref 70–99)
Glucose-Capillary: 132 mg/dL — ABNORMAL HIGH (ref 70–99)

## 2010-10-07 LAB — URINALYSIS, ROUTINE W REFLEX MICROSCOPIC
Bilirubin Urine: NEGATIVE
Hgb urine dipstick: NEGATIVE
Ketones, ur: NEGATIVE mg/dL
Specific Gravity, Urine: 1.02 (ref 1.005–1.030)
Urobilinogen, UA: 0.2 mg/dL (ref 0.0–1.0)

## 2010-10-07 LAB — CBC
HCT: 28.1 % — ABNORMAL LOW (ref 36.0–46.0)
Hemoglobin: 10.1 g/dL — ABNORMAL LOW (ref 12.0–15.0)
Hemoglobin: 10.7 g/dL — ABNORMAL LOW (ref 12.0–15.0)
Hemoglobin: 9.2 g/dL — ABNORMAL LOW (ref 12.0–15.0)
MCH: 27.4 pg (ref 26.0–34.0)
MCH: 27.4 pg (ref 26.0–34.0)
MCH: 27.7 pg (ref 26.0–34.0)
MCH: 28.3 pg (ref 26.0–34.0)
MCH: 28.6 pg (ref 26.0–34.0)
MCH: 28.7 pg (ref 26.0–34.0)
MCH: 28.8 pg (ref 26.0–34.0)
MCHC: 31.3 g/dL (ref 30.0–36.0)
MCHC: 31.9 g/dL (ref 30.0–36.0)
MCHC: 32 g/dL (ref 30.0–36.0)
MCHC: 32.4 g/dL (ref 30.0–36.0)
MCHC: 32.4 g/dL (ref 30.0–36.0)
MCHC: 32.6 g/dL (ref 30.0–36.0)
MCV: 88 fL (ref 78.0–100.0)
MCV: 88.1 fL (ref 78.0–100.0)
MCV: 88.2 fL (ref 78.0–100.0)
MCV: 88.4 fL (ref 78.0–100.0)
MCV: 88.6 fL (ref 78.0–100.0)
Platelets: 168 10*3/uL (ref 150–400)
Platelets: 188 10*3/uL (ref 150–400)
Platelets: 200 10*3/uL (ref 150–400)
Platelets: 210 10*3/uL (ref 150–400)
Platelets: 227 10*3/uL (ref 150–400)
RBC: 3.9 MIL/uL (ref 3.87–5.11)
RBC: 3.94 MIL/uL (ref 3.87–5.11)
RBC: 4.07 MIL/uL (ref 3.87–5.11)
RDW: 13.7 % (ref 11.5–15.5)
RDW: 13.7 % (ref 11.5–15.5)
RDW: 13.8 % (ref 11.5–15.5)
RDW: 13.9 % (ref 11.5–15.5)
WBC: 11 10*3/uL — ABNORMAL HIGH (ref 4.0–10.5)
WBC: 8.5 10*3/uL (ref 4.0–10.5)

## 2010-10-07 LAB — COMPREHENSIVE METABOLIC PANEL
Albumin: 3.6 g/dL (ref 3.5–5.2)
Alkaline Phosphatase: 79 U/L (ref 39–117)
BUN: 11 mg/dL (ref 6–23)
GFR calc Af Amer: >60 mL/min (ref >60)
Potassium: 3.5 meq/L (ref 3.5–5.1)
Sodium: 140 meq/L (ref 135–145)
Total Protein: 6.9 g/dL (ref 6.0–8.3)

## 2010-10-07 LAB — POCT I-STAT, CHEM 8
Chloride: 107 meq/L (ref 96–112)
HCT: 43 % (ref 36.0–46.0)
Hemoglobin: 14.6 g/dL (ref 12.0–15.0)
Potassium: 3.4 meq/L — ABNORMAL LOW (ref 3.5–5.1)
Sodium: 141 meq/L (ref 135–145)

## 2010-10-07 LAB — PROTIME-INR
INR: 0.92 (ref 0.00–1.49)
INR: 0.98 (ref 0.00–1.49)
Prothrombin Time: 13.2 seconds (ref 11.6–15.2)

## 2010-10-07 LAB — POCT CARDIAC MARKERS
CKMB, poc: <1 ng/mL — ABNORMAL LOW (ref 1.0–8.0)
Myoglobin, poc: 279 ng/mL (ref 12–200)

## 2010-10-07 LAB — BASIC METABOLIC PANEL
BUN: 4 mg/dL — ABNORMAL LOW (ref 6–23)
Creatinine, Ser: 0.8 mg/dL (ref 0.4–1.2)
GFR calc non Af Amer: >60 mL/min (ref >60)

## 2010-10-08 DIAGNOSIS — G5700 Lesion of sciatic nerve, unspecified lower limb: Secondary | ICD-10-CM | POA: Insufficient documentation

## 2010-10-08 NOTE — Assessment & Plan Note (Signed)
Plan to increase Lisinopril to 10 mg.  Will check Cr in 1-2 weeks to assure not worsening.   Add back Norvasc for better control.   Will fu in 1 month to assess bp checks.
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
## 2010-10-08 NOTE — Assessment & Plan Note (Signed)
Neck pain from this.  Very tight on exam, several trigger points.  No red flags on physical exam or by history. Recommended heat and OTC analgesics.  To fu in 1 month for improvement.

## 2010-10-09 LAB — BASIC METABOLIC PANEL
BUN: 4 mg/dL — ABNORMAL LOW (ref 6–23)
BUN: 5 mg/dL — ABNORMAL LOW (ref 6–23)
CO2: 23 mEq/L (ref 19–32)
CO2: 26 mEq/L (ref 19–32)
CO2: 28 mEq/L (ref 19–32)
Calcium: 8.7 mg/dL (ref 8.4–10.5)
Calcium: 9.1 mg/dL (ref 8.4–10.5)
Chloride: 105 mEq/L (ref 96–112)
Chloride: 107 mEq/L (ref 96–112)
Creatinine, Ser: 0.75 mg/dL (ref 0.4–1.2)
GFR calc Af Amer: >60 mL/min (ref >60)
GFR calc Af Amer: >60 mL/min (ref >60)
GFR calc Af Amer: >60 mL/min (ref >60)
GFR calc non Af Amer: >60 mL/min (ref >60)
GFR calc non Af Amer: >60 mL/min (ref >60)
Glucose, Bld: 143 mg/dL — ABNORMAL HIGH (ref 70–99)
Glucose, Bld: 73 mg/dL (ref 70–99)
Potassium: 2.8 mEq/L — ABNORMAL LOW (ref 3.5–5.1)
Potassium: 3.1 mEq/L — ABNORMAL LOW (ref 3.5–5.1)
Potassium: 3.2 mEq/L — ABNORMAL LOW (ref 3.5–5.1)
Sodium: 136 mEq/L (ref 135–145)
Sodium: 139 mEq/L (ref 135–145)
Sodium: 139 mEq/L (ref 135–145)

## 2010-10-09 LAB — CBC
HCT: 40.7 % (ref 36.0–46.0)
HCT: 41.2 % (ref 36.0–46.0)
HCT: 41.8 % (ref 36.0–46.0)
Hemoglobin: 13.2 g/dL (ref 12.0–15.0)
Hemoglobin: 13.9 g/dL (ref 12.0–15.0)
Hemoglobin: 13.9 g/dL (ref 12.0–15.0)
MCHC: 32.4 g/dL (ref 30.0–36.0)
MCHC: 33.3 g/dL (ref 30.0–36.0)
MCV: 88.8 fL (ref 78.0–100.0)
Platelets: 201 10*3/uL (ref 150–400)
RBC: 4.58 MIL/uL (ref 3.87–5.11)
RBC: 4.69 MIL/uL (ref 3.87–5.11)
RDW: 13.5 % (ref 11.5–15.5)
RDW: 13.7 % (ref 11.5–15.5)
WBC: 15.1 10*3/uL — ABNORMAL HIGH (ref 4.0–10.5)

## 2010-10-09 LAB — HEMOCCULT GUIAC POC 1CARD (OFFICE): Fecal Occult Bld: NEGATIVE

## 2010-10-10 LAB — LIPASE, BLOOD: Lipase: 21 U/L (ref 11–59)

## 2010-10-10 LAB — COMPREHENSIVE METABOLIC PANEL
ALT: 20 U/L (ref 0–35)
AST: 24 U/L (ref 0–37)
Albumin: 4.3 g/dL (ref 3.5–5.2)
CO2: 25 mEq/L (ref 19–32)
Calcium: 9.5 mg/dL (ref 8.4–10.5)
Creatinine, Ser: 0.68 mg/dL (ref 0.4–1.2)
GFR calc Af Amer: >60 mL/min (ref >60)
GFR calc non Af Amer: >60 mL/min (ref >60)
Sodium: 138 mEq/L (ref 135–145)
Total Protein: 7.5 g/dL (ref 6.0–8.3)

## 2010-10-10 LAB — CBC
HCT: 43 % (ref 36.0–46.0)
Hemoglobin: 14 g/dL (ref 12.0–15.0)
MCHC: 32.5 g/dL (ref 30.0–36.0)
MCV: 87.9 fL (ref 78.0–100.0)
Platelets: 211 10*3/uL (ref 150–400)
RBC: 4.89 MIL/uL (ref 3.87–5.11)
RDW: 13.5 % (ref 11.5–15.5)
WBC: 11.6 10*3/uL — ABNORMAL HIGH (ref 4.0–10.5)

## 2010-10-10 LAB — DIFFERENTIAL
Basophils Absolute: 0 10*3/uL (ref 0.0–0.1)
Basophils Relative: 0 % (ref 0–1)
Eosinophils Absolute: 0 10*3/uL (ref 0.0–0.7)
Eosinophils Relative: 0 % (ref 0–5)
Lymphocytes Relative: 6 % — ABNORMAL LOW (ref 12–46)
Lymphs Abs: 0.7 10*3/uL (ref 0.7–4.0)
Monocytes Absolute: 0.1 10*3/uL (ref 0.1–1.0)
Monocytes Relative: 1 % — ABNORMAL LOW (ref 3–12)
Neutro Abs: 10.8 10*3/uL — ABNORMAL HIGH (ref 1.7–7.7)
Neutrophils Relative %: 93 % — ABNORMAL HIGH (ref 43–77)

## 2010-10-10 LAB — URINALYSIS, ROUTINE W REFLEX MICROSCOPIC
Bilirubin Urine: NEGATIVE
Glucose, UA: NEGATIVE mg/dL
Leukocytes, UA: NEGATIVE
Nitrite: NEGATIVE
Specific Gravity, Urine: 1.026 (ref 1.005–1.030)
pH: 7 (ref 5.0–8.0)

## 2010-10-10 LAB — URINE MICROSCOPIC-ADD ON

## 2010-10-27 ENCOUNTER — Telehealth: Payer: Self-pay | Admitting: Family Medicine

## 2010-10-27 NOTE — Telephone Encounter (Signed)
Pt states that pharm has faxed refill for her HCTZ -  Needs it called in today - [facility] - [address]

## 2010-10-31 NOTE — Telephone Encounter (Signed)
Called to speak with patient.  Left message at home.   She is supposed to be on combination pill of HCTZ-Lisinopril.  This was increased at her last OV.  She's not supposed to be on HCTZ by itself anymore as we made this change last visit. However, she was supposed to have her creatinine checked in 1 week after we increased her medications, namely the Lisinopril.   I do not want to give any refills until I know her creatinine is still within a normal range.   Left message to call clinic so I can talk with her and make sure she's taking the medicines she is supposed to be.

## 2010-10-31 NOTE — Telephone Encounter (Signed)
Patient called back and I spoke with her.   She is taking the Lisinopril and HCTZ as two separate pills, not taking the  Amlodipine. Will fu next week for her appt ad check creatinine then and see if we can just do combo pill of HCTZ/Lisionpril. No refills for today.

## 2010-11-07 ENCOUNTER — Ambulatory Visit (INDEPENDENT_AMBULATORY_CARE_PROVIDER_SITE_OTHER): Payer: BC Managed Care – PPO | Admitting: Family Medicine

## 2010-11-07 ENCOUNTER — Encounter: Payer: Self-pay | Admitting: Family Medicine

## 2010-11-07 VITALS — BP 149/101 | HR 74 | Temp 97.9°F | Ht 64.0 in | Wt 199.8 lb

## 2010-11-07 DIAGNOSIS — G5700 Lesion of sciatic nerve, unspecified lower limb: Secondary | ICD-10-CM

## 2010-11-07 DIAGNOSIS — R5383 Other fatigue: Secondary | ICD-10-CM | POA: Insufficient documentation

## 2010-11-07 DIAGNOSIS — R739 Hyperglycemia, unspecified: Secondary | ICD-10-CM

## 2010-11-07 DIAGNOSIS — R5381 Other malaise: Secondary | ICD-10-CM

## 2010-11-07 DIAGNOSIS — I10 Essential (primary) hypertension: Secondary | ICD-10-CM

## 2010-11-07 DIAGNOSIS — R7309 Other abnormal glucose: Secondary | ICD-10-CM

## 2010-11-07 LAB — CBC
MCH: 27.8 pg (ref 26.0–34.0)
MCV: 88.6 fL (ref 78.0–100.0)
Platelets: 240 10*3/uL (ref 150–400)
RDW: 13.9 % (ref 11.5–15.5)

## 2010-11-07 LAB — TSH: TSH: 1.056 u[IU]/mL (ref 0.350–4.500)

## 2010-11-07 LAB — POCT GLYCOSYLATED HEMOGLOBIN (HGB A1C): Hemoglobin A1C: 6.1

## 2010-11-07 LAB — BASIC METABOLIC PANEL
Calcium: 9.8 mg/dL (ref 8.4–10.5)
Creat: 0.78 mg/dL (ref 0.40–1.20)

## 2010-11-07 MED ORDER — LISINOPRIL-HYDROCHLOROTHIAZIDE 10-12.5 MG PO TABS: 1.0000 | ORAL_TABLET | Freq: Every day | ORAL | Status: DC

## 2010-11-07 NOTE — Patient Instructions (Addendum)
The combination pill with be both Lisinopril and HCTZ. Take this daily. Come back in 3 months and we will recheck your blood pressure.   Keep walking for exercise, that's very good for you! In 3 months, we'll do your Pap smear.
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
## 2010-11-08 ENCOUNTER — Encounter: Payer: Self-pay | Admitting: Family Medicine

## 2010-11-08 LAB — VITAMIN D 25 HYDROXY (VIT D DEFICIENCY, FRACTURES): Vit D, 25-Hydroxy: 19 ng/mL — ABNORMAL LOW (ref 30–89)

## 2010-11-08 MED ORDER — VITAMIN D (ERGOCALCIFEROL) 1.25 MG (50000 UNIT) PO CAPS: ORAL_CAPSULE | ORAL | Status: DC

## 2010-11-08 NOTE — Progress Notes (Signed)
Addended byRenold Don on: 11/08/2010 08:22 PM   Modules accepted: Orders

## 2010-11-08 NOTE — Assessment & Plan Note (Signed)
Continue OTC analgesia, stretching, walking as much as possible as conservative treatment.  If no improvement, will plan referral to physical therapy, maybe sports med clinic.  Patient declined today.

## 2010-11-08 NOTE — Assessment & Plan Note (Addendum)
After much discussion with patient and looking up prices online, plan is to continue Lisinopril 10 mg/HCTZ 12.5 mg combo pill.   She is to check blood pressures at pharmacy near her house and return if continue to be elevated.   Creatinine today has been taking Lisinopril 10 (without HCTZ), higher dose than previous dose.  FU bp's in 3 months.

## 2010-11-08 NOTE — Assessment & Plan Note (Signed)
Check TSH, Vit D levels, CBC for anemia. Likely secondary to working day as fatigue relieved when she goes for a walk and often not as noticeable on weekend.

## 2010-12-09 NOTE — Group Therapy Note (Signed)
NAME:  Shelly Wheeler, Shelly Wheeler NO.:  [id number]   MEDICAL RECORD NO.:  [id number]          PATIENT TYPE:  WOC   LOCATION:  [facility]                   FACILITY:  [facility]   PHYSICIAN:  Argentina Donovan, MD        DATE OF BIRTH:  Sep 06, 1945   DATE OF SERVICE:                                  CLINIC NOTE   The patient is a 65 year old gravida 2, para 2-0-0-2 who works as a  Electrical engineer.  She comes in for her routine GYN examination that has  not been done in two years. On exam, her blood pressure is 172/115.  Pulse is 80 and regular.  She is on hydrochlorothiazide; has been on  that for a couple of years, but has not seen an internist in that time.  __________ according to what she did.  We rechecked it after her visit  today and it was 144/100.  We will get a CMET and a BMET on this  patient.   PHYSICAL EXAMINATION:  Her breasts are symmetrical with no dominant  masses.  Abdomen is soft, flat and nontender.  No masses or  organomegaly.  External genitalia is normal.  BUS is within normal  limits.  Her vagina is clean and well rugated.  Cervix is clean and  parous.  Uterus anterior, normal size, shape and consistency.  The  adnexa could not be palpated.  The __________ reveals no masses.   The patient is going to be needing a prescription of Labetalol 200  b.i.d. until we can get her to an internist.  We are going to try to  make an appointment for that as well a mammogram.   IMPRESSION:  1. Hypertension.  2. Normal GYN examination.           ______________________________  Argentina Donovan, MD     PR/MEDQ  D:  06/21/2006  T:  06/21/2006  Job:  [id number]

## 2010-12-29 ENCOUNTER — Encounter (INDEPENDENT_AMBULATORY_CARE_PROVIDER_SITE_OTHER): Payer: Self-pay | Admitting: General Surgery

## 2011-01-05 ENCOUNTER — Ambulatory Visit: Payer: BC Managed Care – PPO | Admitting: Family Medicine

## 2011-02-10 ENCOUNTER — Other Ambulatory Visit: Payer: Self-pay | Admitting: Family Medicine

## 2011-02-10 ENCOUNTER — Encounter: Payer: Self-pay | Admitting: Family Medicine

## 2011-02-10 ENCOUNTER — Other Ambulatory Visit (HOSPITAL_COMMUNITY)
Admission: RE | Admit: 2011-02-10 | Discharge: 2011-02-10 | Disposition: A | Discharge status: Home / Self Care | Payer: BC Managed Care – PPO | Source: Ambulatory Visit | Attending: Family Medicine | Admitting: Family Medicine

## 2011-02-10 ENCOUNTER — Ambulatory Visit (INDEPENDENT_AMBULATORY_CARE_PROVIDER_SITE_OTHER): Payer: BC Managed Care – PPO | Admitting: Family Medicine

## 2011-02-10 VITALS — BP 157/105 | HR 65 | Temp 97.4°F | Ht 64.0 in | Wt 202.0 lb

## 2011-02-10 DIAGNOSIS — I10 Essential (primary) hypertension: Secondary | ICD-10-CM

## 2011-02-10 DIAGNOSIS — Z13220 Encounter for screening for lipoid disorders: Secondary | ICD-10-CM

## 2011-02-10 DIAGNOSIS — N6489 Other specified disorders of breast: Secondary | ICD-10-CM

## 2011-02-10 DIAGNOSIS — Z01419 Encounter for gynecological examination (general) (routine) without abnormal findings: Secondary | ICD-10-CM | POA: Insufficient documentation

## 2011-02-10 DIAGNOSIS — E785 Hyperlipidemia, unspecified: Secondary | ICD-10-CM

## 2011-02-10 DIAGNOSIS — Z23 Encounter for immunization: Secondary | ICD-10-CM

## 2011-02-10 DIAGNOSIS — T148XXA Other injury of unspecified body region, initial encounter: Secondary | ICD-10-CM

## 2011-02-10 DIAGNOSIS — Z1231 Encounter for screening mammogram for malignant neoplasm of breast: Secondary | ICD-10-CM

## 2011-02-10 DIAGNOSIS — Z124 Encounter for screening for malignant neoplasm of cervix: Secondary | ICD-10-CM

## 2011-02-10 DIAGNOSIS — Z0000 Encounter for general adult medical examination without abnormal findings: Secondary | ICD-10-CM

## 2011-02-10 LAB — LIPID PANEL
HDL: 51 mg/dL (ref >39)
LDL Cholesterol: 130 mg/dL — ABNORMAL HIGH (ref 0–99)
Total CHOL/HDL Ratio: 3.9 Ratio
Triglycerides: 101 mg/dL (ref <150)
VLDL: 20 mg/dL (ref 0–40)

## 2011-02-10 MED ORDER — LISINOPRIL-HYDROCHLOROTHIAZIDE 20-25 MG PO TABS: 1.0000 | ORAL_TABLET | Freq: Every day | ORAL | Status: DC

## 2011-02-10 NOTE — Assessment & Plan Note (Signed)
Lipid panel today

## 2011-02-10 NOTE — Assessment & Plan Note (Signed)
Plan to increase bp meds today, double dose and recheck in 3 months
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
## 2011-02-10 NOTE — Progress Notes (Signed)
  Subjective:    Patient ID: Shelly Wheeler, female    DOB: 1946/04/18, 65 y.o.   MRN: [id number]  HPI 1.  Hypertension:  Still elevated today.  Long-term problem for this patient.  No adverse effects from medication.  Not checking it regularly.  No HA, CP, dizziness, shortness of breath, palpitations, or LE swelling.   BP Readings from Last 3 Encounters:  02/10/11 157/105  11/07/10 149/101  10/06/10 150/105   2.  HLD:  Last lipid panel listed below.    Currently is not on Statin.  Denies any myalgias, icterus, jaundice.  Will recheck today.   Lab Results  Component Value Date   CHOL 190 10/12/2009   Lab Results  Component Value Date   HDL 58 10/12/2009   Lab Results  Component Value Date   LDLCALC 114* 10/12/2009   Lab Results  Component Value Date   TRIG 88 10/12/2009   Lab Results  Component Value Date   CHOLHDL 3.3 Ratio 10/12/2009   3.  Screening:  Pap smear today.  Possibly last one as she is age 64.  No vaginal discharge, bleeding, abdominal pain, dysuria.      Review of Systems See HPI above for review of systems.       Objective:   Physical Exam Gen:  Alert, cooperative patient who appears stated age in no acute distress.  Vital signs reviewed. Breast:  Left breast still with hematoma calcifications present.  Nipple retraction noted, present on previous exam, unchanged.  No nipple discharge.  Hematoma has cleared, however mild hyperpigmentation of breast in area of previous hematoma persists.  Right breast with no nipple retraction, no discharge, no masses palpated on exam.   Cardiac:  Regular rate and rhythm without murmur auscultated.  Good S1/S2. Pulm:  Clear to auscultation bilaterally with good air movement.  No wheezes or rales noted.   GYN:  External genitalia within normal limits.  Vaginal mucosa pink, moist, normal rugae.  Nonfriable cervix without lesions, no discharge or bleeding noted on speculum exam.  Bimanual exam revealed normal, nongravid uterus.  No  cervical motion tenderness. No adnexal masses bilaterally.  Pap smear obtained.          Assessment & Plan:

## 2011-02-13 ENCOUNTER — Encounter: Payer: Self-pay | Admitting: Family Medicine

## 2011-02-16 ENCOUNTER — Ambulatory Visit
Admission: RE | Admit: 2011-02-16 | Discharge: 2011-02-16 | Disposition: A | Discharge status: Home / Self Care | Payer: BC Managed Care – PPO | Source: Ambulatory Visit | Attending: Family Medicine | Admitting: Family Medicine

## 2011-02-16 DIAGNOSIS — T148XXA Other injury of unspecified body region, initial encounter: Secondary | ICD-10-CM

## 2011-02-16 DIAGNOSIS — N6489 Other specified disorders of breast: Secondary | ICD-10-CM

## 2011-02-20 ENCOUNTER — Other Ambulatory Visit: Payer: Self-pay | Admitting: Family Medicine

## 2011-02-20 DIAGNOSIS — Z0000 Encounter for general adult medical examination without abnormal findings: Secondary | ICD-10-CM

## 2011-02-20 DIAGNOSIS — N63 Unspecified lump in unspecified breast: Secondary | ICD-10-CM

## 2011-04-11 ENCOUNTER — Ambulatory Visit (INDEPENDENT_AMBULATORY_CARE_PROVIDER_SITE_OTHER): Payer: BC Managed Care – PPO | Admitting: Family Medicine

## 2011-04-11 ENCOUNTER — Ambulatory Visit (INDEPENDENT_AMBULATORY_CARE_PROVIDER_SITE_OTHER): Payer: BC Managed Care – PPO | Admitting: General Surgery

## 2011-04-11 ENCOUNTER — Encounter: Payer: Self-pay | Admitting: Family Medicine

## 2011-04-11 ENCOUNTER — Encounter (INDEPENDENT_AMBULATORY_CARE_PROVIDER_SITE_OTHER): Payer: Self-pay | Admitting: General Surgery

## 2011-04-11 DIAGNOSIS — N6489 Other specified disorders of breast: Secondary | ICD-10-CM

## 2011-04-11 DIAGNOSIS — E785 Hyperlipidemia, unspecified: Secondary | ICD-10-CM

## 2011-04-11 DIAGNOSIS — M62838 Other muscle spasm: Secondary | ICD-10-CM | POA: Insufficient documentation

## 2011-04-11 DIAGNOSIS — I10 Essential (primary) hypertension: Secondary | ICD-10-CM

## 2011-04-11 DIAGNOSIS — K219 Gastro-esophageal reflux disease without esophagitis: Secondary | ICD-10-CM

## 2011-04-11 MED ORDER — AMLODIPINE BESYLATE 5 MG PO TABS: 5.0000 mg | ORAL_TABLET | Freq: Every day | ORAL | Status: DC

## 2011-04-11 MED ORDER — BACLOFEN 10 MG PO TABS: 10.0000 mg | ORAL_TABLET | Freq: Every day | ORAL | Status: DC

## 2011-04-11 MED ORDER — LISINOPRIL 40 MG PO TABS: 40.0000 mg | ORAL_TABLET | Freq: Every day | ORAL | Status: DC

## 2011-04-11 NOTE — Assessment & Plan Note (Signed)
Trial of restarting Pepcid. She is to take this for 4-6 weeks. If she has continued to have problems may need to switch to a PPI.

## 2011-04-11 NOTE — Assessment & Plan Note (Signed)
Short-term course of baclofen. See instructions. Conservative therapy for now.

## 2011-04-11 NOTE — Assessment & Plan Note (Signed)
Would like referral to nutrition rather than starting medications. We'll refer to Dr. Gerilyn Pilgrim. We'll recheck lipid panel in 6 months.
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
## 2011-04-11 NOTE — Assessment & Plan Note (Signed)
Patient no longer take hydrochlorothiazide. Like to keep her on lisinopril. We'll also add Norvasc. Gave warnings against hypotension and orthostatic symptoms.
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
## 2011-04-11 NOTE — Patient Instructions (Addendum)
Heat and massage are the best things for your neck.  I will call in a prescription muscle relaxer for you. Use this when you go to sleep. It can make you sleepy so I would not use it during the week but it's okay to use on the weekend if you're not going to drive.  I will refer you to nutrition with Dr. Gerilyn Pilgrim here in our clinic.  She will call you with an appointment.  Will be starting a new blood pressure medicine for you called Norvasc. I am also going to send in a new prescription so that the lisinopril is by itself. It will NOT be a combination pill with HCTZ.  For your reflux, start taking your Pepcid over-the-counter again. Just take one pill a day.

## 2011-05-30 ENCOUNTER — Ambulatory Visit: Payer: BC Managed Care – PPO | Admitting: Family Medicine

## 2011-06-06 ENCOUNTER — Encounter: Payer: Self-pay | Admitting: Family Medicine

## 2011-06-06 ENCOUNTER — Ambulatory Visit (INDEPENDENT_AMBULATORY_CARE_PROVIDER_SITE_OTHER): Payer: BC Managed Care – PPO | Admitting: Family Medicine

## 2011-06-06 DIAGNOSIS — E785 Hyperlipidemia, unspecified: Secondary | ICD-10-CM

## 2011-06-06 DIAGNOSIS — I10 Essential (primary) hypertension: Secondary | ICD-10-CM

## 2011-06-06 NOTE — Patient Instructions (Addendum)
-   Ask Dr. Gwendolyn Grant about re-checking your vitamin D level again.   - If your vitamin D level is still low, let's talk about using a different prescription for it, REPLESTA, a vitamin D3 prescription, to see if you respond any better to this form.   - Your iron levels are low.  Please follow up with Dr. Nehemiah Massed about this.  He may want to do an iron panel study.   - Increase physical activity:  GOALS:    1. Walking shoes and socks in the car the night before each workday.    2. Get information about the [employer] class and [employer] program: [phone].    3. Email Dr. Gerilyn Pilgrim this information when you find out (what, where, and when of classes):  [email or web address].  4. Track how many times per week you do walk, and for how many minutes.   - Aim for vegetables at least twice a day, and at least one fruit per day.  This will help you get enough potassium in your diet to benefit your blood pressure.   - Try to consume no more than 1500 mg of sodium per day.  Look into sodium contents of restaurant foods.  - AT YOUR NEXT APPT WE'LL TALK ABOUT REDUCING YOUR INTAKE OF SWEET DRINKS.

## 2011-06-06 NOTE — Progress Notes (Signed)
Medical Nutrition Therapy:  Appt start time: 1530 end time:  1630.  Assessment:  Primary concerns today: Blood sugar control and BP.  Ms. Hulet wants to learn to choose healthy foods to help lower her BP and cholesterol, and to prevent DM.  Ms Koerner lives alone; hs been eating out a lot recently.  Usual eating pattern includes 3 meals and 0-1 snack per day.  Everyday foods include at least 1 veg and 1 fruit (4-5 X wk), 0-32 oz lemonade, 64 oz sweet tea/wk, & 0-36 oz soda/day.  Avoided foods include alcohol, pork.   24-hr recall: B- none; L (2 PM)- 1 c macaroni & chs, 1 c roasted potatoes, 1 c collards, salad w/ 1 tbsp dressing, 1 roll, lemon pound cake, ice cream, 12 oz lemonade, water; D (6 PM)- [employer] fried fish combo, which includes Ff's, 8 oz Sprite;  Snk (10 PM)- 1 pb cracker.  Yesterday was atypical in that she was out of town, and did not have breakfast.  A more typical weekday includes B- cereal (high-fiber, low-sugar Frosted Flakes) w/ banana or boiled egg; L- [place] sandwich on whwht or veg's from school cafeteria; D- e.g., cafeteria: veg's, roll, tea, maybe meat, or [employer] or [place] (fried fish).   Usual physical activity includes none.  She used to walk at the mall b/c it used to open early, and was able to walk before work, but it is not open early any more, and she has not gotten back into an exercise routine.    Progress Towards Goal(s):  In progress.   Nutritional Diagnosis:  NB-2.1 Physical inactivity As related to poor motivation.  As evidenced by no regular exercise. NI-5.8.3 Inappropriate intake of types of carbohydrates (specify): (sugar) As related to beverages.  As evidenced by usual intake of several sugar-sweetened beverages daily.    Intervention:  Nutrition education.  Monitoring/Evaluation:  Dietary intake, exercise, and body weight in 1 month.

## 2011-06-23 ENCOUNTER — Ambulatory Visit (INDEPENDENT_AMBULATORY_CARE_PROVIDER_SITE_OTHER): Payer: BC Managed Care – PPO | Admitting: Family Medicine

## 2011-06-23 ENCOUNTER — Encounter: Payer: Self-pay | Admitting: Family Medicine

## 2011-06-23 DIAGNOSIS — I10 Essential (primary) hypertension: Secondary | ICD-10-CM

## 2011-06-23 DIAGNOSIS — I77810 Thoracic aortic ectasia: Secondary | ICD-10-CM

## 2011-06-23 DIAGNOSIS — R5381 Other malaise: Secondary | ICD-10-CM

## 2011-06-23 DIAGNOSIS — R5383 Other fatigue: Secondary | ICD-10-CM

## 2011-06-23 MED ORDER — ESOMEPRAZOLE MAGNESIUM 40 MG PO PACK: 40.0000 mg | PACK | Freq: Every day | ORAL | Status: DC

## 2011-06-23 NOTE — Patient Instructions (Signed)
I am going to get you set up for an Echo.  It is an ultrasound of your heart.   Someone will call and set up a time for you.  For the food getting stuck, try the Prilosec 1 pill a day for the next month. Come back and see me in early to mid January.

## 2011-06-25 ENCOUNTER — Encounter: Payer: Self-pay | Admitting: Family Medicine

## 2011-06-25 NOTE — Assessment & Plan Note (Signed)
Patient doesn't like the Norvasc.   States it makes her feel "bad." She would really like to try diet control to HTN.  I am hesitant about stopping all of her medications, compromise is to leave patient on Lisinopril and continue working on diet.   She had not been on Norvasc for about 1 month.

## 2011-06-25 NOTE — Assessment & Plan Note (Signed)
Last Hgb in April was 13.4 She has not had Vitamin D tested since that time either. I am concerned for possible aortic valve involvement and fact she has history of cardiomyopathy, unclear etiology. Will set up patient for Echo -- also to evaluate ectasia above. Deconditioning also a possibility, discussed this with patient as well.

## 2011-06-25 NOTE — Progress Notes (Signed)
  Subjective:    Patient ID: Shelly Wheeler, female    DOB: April 15, 1946, 65 y.o.   MRN: [id number]  HPI  1.  Fatigue:  Still present most days.  Admits to limited physical activity. Feels short of breath after shopping for groceries, walking from parking lot to buildings.  No chest pain/palpitations.  Does have history of cardiomyopathy diagnosed in 1990s incidentally.    2.  Feelings of "food getting stuck":  Present for past month or so.  States this is with large bolus meals, mostly meat but sometimes with softer foods as well but inconsistently.  No symptoms when drinking.  No odynophagia.  Cannot remember last time she had reflux, no longer taking Famotidine or anything for reflux.  No nausea, change in appetite, vomiting. No abd pain  3.  Diet:  Enjoyed meeting with nutritionist.  Discussed appropriate food choices, ways to change her diet.  She is very excited about the prospect of losing weight and eventually getting of all of her hypertensive medications.    4. Hypertension:  Long-term problem for this patient.  No adverse effects from medication.  Not checking it regularly.  No HA, CP, dizziness, shortness of breath, palpitations, or LE swelling.  Has been out of Amlodipine for past week or so. BP Readings from Last 3 Encounters:  06/23/11 143/99  04/11/11 142/86  04/11/11 136/88   I have reviewed the patient's pertinent past medical, surgical, family, and social history, including cardiomyopathy.  Does have cardiomyopathy on last CXR in hospital.  Review of Systems See HPI above for review of systems.        Objective:   Physical Exam  Gen:  Alert, cooperative patient who appears stated age in no acute distress.  Vital signs reviewed. HEENT:  [place]/AT.  EOMI, PERRL.  MMM, tonsils non-erythematous, non-edematous.  External ears WNL, Bilateral TM's normal without retraction, redness or bulging.  Neck: No masses or thyromegaly or limitation in range of motion.  No cervical  lymphadenopathy. Pulm:  Clear to auscultation bilaterally with good air movement.  No wheezes or rales noted.   Heart:  RRR, Grade II/VI SEM BL upper sternal borders Abd:  Soft/nondistended/nontender.  Good bowel sounds throughout all four quadrants.  No masses noted.  Ext:  No clubbing/cyanosis/erythema.  No edema noted bilateral lower extremities.         Assessment & Plan:

## 2011-06-26 ENCOUNTER — Encounter: Payer: Self-pay | Admitting: Family Medicine

## 2011-06-26 ENCOUNTER — Ambulatory Visit (INDEPENDENT_AMBULATORY_CARE_PROVIDER_SITE_OTHER): Payer: BC Managed Care – PPO | Admitting: Family Medicine

## 2011-06-26 DIAGNOSIS — B349 Viral infection, unspecified: Secondary | ICD-10-CM

## 2011-06-26 DIAGNOSIS — B9789 Other viral agents as the cause of diseases classified elsewhere: Secondary | ICD-10-CM

## 2011-06-26 NOTE — Assessment & Plan Note (Signed)
Symptoms likely due to viral illness. Since patient is now 3 days out from initiation of symptoms, we'll not test for fluid this time as treat with Tamiflu is not indicated. Recommend patient still receive flu shot after symptoms resolve. Discuss possibility of secondary bacterial infection in warning signs of such an infection. Provided patient with work note while illness persists. Patient okay to use Tylenol and NSAIDs as needed. Patient okay to use antitussive medications and discussed over-the-counter preparations available.

## 2011-06-26 NOTE — Patient Instructions (Addendum)
Your symptoms are very consistent with a viral illness. I think you are through the worst her illness and should start getting better. Please feel  free to try taking Tylenol, up to 1 gram every 6 hours, up to 800mg  Motrin every 6 hours as needed for fever and headache. I think it would be well to have you come back when you're feeling better to get a flu shot. You do not need to schedule an appointment to see a doctor for that but can have one of the nurses performed a for you. Issue if you should feel considerably worse at home, should have difficulty breathing, or unable to keep down any food, then please come back here to the office for a visit or to the emergency room. I would recommend either staying home from work until you her overall feeling better and aren't without a fever for 24 hours or continue to wear a mask at work. We consider a fever, any temperature above 100,4 degrees. Have a great day. I hope you feel better.

## 2011-06-26 NOTE — Progress Notes (Signed)
  Subjective:    Patient ID: Shelly Wheeler, female    DOB: [birth year], 65 y.o.   MRN: [id number]  HPI Cough and malaise: Patient went to bed on Friday feeling "off ". Woke up Saturday morning with subjective fever, headache, cough, body aches, chills. Symptoms have persisted throughout the weekend and has not taken any medications for relief of symptoms. 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 Patient works as a Geophysicist/field seismologist at [employer]. Patient has been able to tolerate more bland foods and soups during this time. Patient has not had her flu shot this year.    Review of Systems Negative: Nausea, vomiting, rash, shortness of breath, syncope, dyspnea  Positive: Cough, chills, diarrhea    Objective:   Physical Exam   general: No acute distress, tired appearing   HEENT: Left cervical lymphadenopathy, moist because membranes, oropharynx clear   cardiovascular: Regular in rhythm, no murmurs rubs or gallops Respiratory: Her to auscultation bilaterally, normal effort, occasional cough. Abdominal: Normoactive bowel sounds, nonpainful to palpation Skin: No rashes, intact     Assessment & Plan:

## 2011-06-30 ENCOUNTER — Telehealth: Payer: Self-pay | Admitting: Family Medicine

## 2011-06-30 NOTE — Telephone Encounter (Signed)
Pt seen on Monday, went back to work yesterday and had to leave again b/c she still wasn't feeling well, wants to know if she can have another excuse note & also wants to know what else she can do or take to feel better?

## 2011-06-30 NOTE — Telephone Encounter (Signed)
Returned call to patient.  Still not feeling completely better.  Does not have fever.  Appetite decreased.  Wants to know what else she can take for body aches.  Informed she can continue to take Tylenol and ibuprofen for body aches.  Continue to drink plenty of fluids to stay hydrated.  Informed that flu symptoms can last up to a week.  Will fax work note to employer.  Patient verbalized understanding and will call back for appt if needed.  Gaylene Brooks, RN

## 2011-06-30 NOTE — Telephone Encounter (Signed)
Calling back, says to fax the excuse note to her work, Attn: Fish farm manager at [phone]

## 2011-07-05 ENCOUNTER — Ambulatory Visit (HOSPITAL_COMMUNITY): Payer: BC Managed Care – PPO

## 2011-07-05 ENCOUNTER — Ambulatory Visit (HOSPITAL_COMMUNITY)
Admission: RE | Admit: 2011-07-05 | Discharge: 2011-07-05 | Disposition: A | Discharge status: Home / Self Care | Payer: BC Managed Care – PPO | Source: Ambulatory Visit | Attending: Family Medicine | Admitting: Family Medicine

## 2011-07-05 DIAGNOSIS — I079 Rheumatic tricuspid valve disease, unspecified: Secondary | ICD-10-CM | POA: Insufficient documentation

## 2011-07-05 DIAGNOSIS — I77810 Thoracic aortic ectasia: Secondary | ICD-10-CM

## 2011-07-05 DIAGNOSIS — R5381 Other malaise: Secondary | ICD-10-CM | POA: Insufficient documentation

## 2011-07-05 DIAGNOSIS — I379 Nonrheumatic pulmonary valve disorder, unspecified: Secondary | ICD-10-CM

## 2011-07-05 DIAGNOSIS — R011 Cardiac murmur, unspecified: Secondary | ICD-10-CM | POA: Insufficient documentation

## 2011-07-05 DIAGNOSIS — R079 Chest pain, unspecified: Secondary | ICD-10-CM | POA: Insufficient documentation

## 2011-07-05 DIAGNOSIS — I10 Essential (primary) hypertension: Secondary | ICD-10-CM | POA: Insufficient documentation

## 2011-07-05 DIAGNOSIS — R5383 Other fatigue: Secondary | ICD-10-CM

## 2011-07-05 NOTE — Progress Notes (Signed)
  Echocardiogram 2D Echocardiogram has been performed.  Juanita Laster Luther Newhouse, RDCS 07/05/2011, 3:43 PM

## 2011-07-06 ENCOUNTER — Encounter: Payer: Self-pay | Admitting: Family Medicine

## 2011-07-06 ENCOUNTER — Ambulatory Visit: Payer: BC Managed Care – PPO | Admitting: Family Medicine

## 2011-07-06 NOTE — Patient Instructions (Signed)
IT WAS GOOD TO SEE YOU TODAY  GOALS UNTIL NECK VISIT WILL BE: INCREASE YOUR FRESH AND FROZEN VEGETABLE INTAKE AVOID CANNED VEGETABLES GET BACK TO YOUR EXERCISE ROUTINE CALL WITH ANY QUESTIONS, MERRY CHRISTMAS AND GOD BLESS Doree Albee MD

## 2011-07-06 NOTE — Progress Notes (Signed)
Pt is here for a follow up nutrition visit:   Pt recently looked into program that involves meeting once per week for diet education as well as exercise. Program is 6 weeks long. Program is called cooperative extension.  Common Foods: Pt states that she likes to eat greens for lunch and dinner (salad and green beans). Snack foods include fruti cups, snack peaches, bananas, apples.   Exercise: none because of flu sxs. Prior to this, pt states that she walked daily. Pt states that she walks at the mall.   24 hour recall: chicken minis at [employer] (3) and water, spaghetti and breadstick for lunch,  spaghetti, turnip greens, stewed apples for dinner.   Is this typical: yes.   Pitfalls: Pt states that she has been drinking a lot of sodas.    Goals: go back to eating more vegetables and avid canned vegetables.

## 2011-10-03 ENCOUNTER — Ambulatory Visit (INDEPENDENT_AMBULATORY_CARE_PROVIDER_SITE_OTHER): Payer: BC Managed Care – PPO | Admitting: Family Medicine

## 2011-10-03 ENCOUNTER — Encounter: Payer: Self-pay | Admitting: Family Medicine

## 2011-10-03 VITALS — BP 160/100 | HR 84 | Temp 98.8°F | Ht 64.0 in | Wt 193.0 lb

## 2011-10-03 DIAGNOSIS — B349 Viral infection, unspecified: Secondary | ICD-10-CM

## 2011-10-03 DIAGNOSIS — B9789 Other viral agents as the cause of diseases classified elsewhere: Secondary | ICD-10-CM

## 2011-10-03 DIAGNOSIS — J029 Acute pharyngitis, unspecified: Secondary | ICD-10-CM

## 2011-10-03 DIAGNOSIS — I10 Essential (primary) hypertension: Secondary | ICD-10-CM

## 2011-10-03 DIAGNOSIS — J020 Streptococcal pharyngitis: Secondary | ICD-10-CM

## 2011-10-03 MED ORDER — PENICILLIN V POTASSIUM 500 MG PO TABS: 500.0000 mg | ORAL_TABLET | Freq: Three times a day (TID) | ORAL | Status: AC

## 2011-10-03 NOTE — Progress Notes (Signed)
  Subjective:    Patient ID: Shelly Wheeler, female    DOB: 1946-05-10, 66 y.o.   MRN: [id number]  HPI Pt presents today with URI symptoms. Symptoms have been present for 3 days . Symptoms include rhinorrhea, nasal congestion, post nasal drip, sore throat, cough. + subjective  Fevers at home. PO intake and UOP have been at baseline. NO rash. + Multiple sick contacts with similar sxs. +  cardiorespiratory risk factors in HTN. Pt has been taking NSAID type medications for headache Pt has not had flu shot. Pt is a nonsmoker.  Pt also presents today with significantly elevated BP. Pt states that she has not taken her lisinopril for the last 3 days. Pt denies any CP, SOB, weakness, vision changes. Pt has had a mild headache.      Review of Systems See HPI, otherwise ROS negative     Objective:   Physical Exam Gen: up in chair, NAD HEENT: NCAT, EOMI, TMs clear bilaterally, +nasal erythema, rhinorrhea bilaterally, + post oropharyngeal erythema  CV: RRR, no murmurs auscultated PULM: CTAB, no wheezes, rales, rhoncii ABD: S/NT/+ bowel sounds  EXT: 2+ peripheral pulses Neuro: CN II-XII grossly intact, no focal neurological deficits    Assessment & Plan:

## 2011-10-03 NOTE — Assessment & Plan Note (Signed)
Hypertensive urgency today. Multifactorial with pt not being on medication and NSAID use. No signs of end organ damage on exam today. Will check CBC and CMET. Discussed NSAID and decongestant avoidance. Instructed pt to come back in later this week for a BP recheck and follow up with PCP in 1 week.

## 2011-10-03 NOTE — Assessment & Plan Note (Addendum)
Viral illness. Discussed supportive care and infectious red flags. Discussed NSAID and nasal decongestant avoidance in setting of HTN. Infectious red flags reviewed. Follow up prn.   Update: Rapid strep test turned positive after patient left the office. Patient was contacted over the phone in context of results. Penicillin VK prescription was sent to the pharmacy. Patient was made aware of this.

## 2011-10-03 NOTE — Progress Notes (Signed)
Addended by: Floydene Flock on: 10/03/2011 04:08 PM   Modules accepted: Orders

## 2011-10-03 NOTE — Assessment & Plan Note (Signed)
Rapid strep test turned positive after patient left the office. Patient was contacted over the phone in context of results. Penicillin VK prescription was sent to the pharmacy. Patient was made aware of this.

## 2011-10-03 NOTE — Patient Instructions (Addendum)
It was good to see today Likely have a viral sore throat If you develop any fever, vomiting, abdominal pain, or any other concerning symptoms please give [place] a call Try to avoid different medications including ibuprofen, Aleve, nasal decongestants as these can significantly raise her blood pressure We're checking some blood work today Be sure to resume taking your blood pressure medications Come back in later this week for a blood pressure recheck Followup with Dr. Gwendolyn Grant next week, Call if any questions,  God Bless, Doree Albee MD

## 2011-10-04 LAB — COMPREHENSIVE METABOLIC PANEL WITH GFR
ALT: 34 U/L (ref 0–35)
AST: 33 U/L (ref 0–37)
Albumin: 4.1 g/dL (ref 3.5–5.2)
Alkaline Phosphatase: 84 U/L (ref 39–117)
BUN: 9 mg/dL (ref 6–23)
CO2: 27 meq/L (ref 19–32)
Calcium: 9.5 mg/dL (ref 8.4–10.5)
Chloride: 103 meq/L (ref 96–112)
Creat: 0.75 mg/dL (ref 0.50–1.10)
Glucose, Bld: 88 mg/dL (ref 70–99)
Potassium: 3.6 meq/L (ref 3.5–5.3)
Sodium: 140 meq/L (ref 135–145)
Total Bilirubin: 1.3 mg/dL — ABNORMAL HIGH (ref 0.3–1.2)
Total Protein: 6.7 g/dL (ref 6.0–8.3)

## 2011-10-04 LAB — CBC
Platelets: 208 10*3/uL (ref 150–400)
RBC: 4.65 MIL/uL (ref 3.87–5.11)
WBC: 18.3 10*3/uL — ABNORMAL HIGH (ref 4.0–10.5)

## 2011-10-06 ENCOUNTER — Telehealth: Payer: Self-pay | Admitting: Family Medicine

## 2011-10-06 NOTE — Telephone Encounter (Signed)
Patient returned call to [place]. °

## 2011-10-06 NOTE — Telephone Encounter (Addendum)
Patient states she took penicillin twice on Tuesday and three times on Wednesday. Did not take at all yesterday or today. She had diarrhea , lips swelling and rash on her chest while taking the antibiotic.  Paged Dr. Gwendolyn Grant and he will send in another antibiotic. Advised patient to not take penicillin anymore.  patient states her throat  is not sore now.

## 2011-10-06 NOTE — Telephone Encounter (Signed)
Pt was put on an abx and it was too strong and messed her stomach up - wants to know if there is another abx that she can take.

## 2011-10-06 NOTE — Telephone Encounter (Signed)
Text paged  Dr. Gwendolyn Grant to send in Rx again.

## 2011-10-07 MED ORDER — AZITHROMYCIN 250 MG PO TABS: ORAL_TABLET | ORAL | Status: AC

## 2011-10-07 NOTE — Telephone Encounter (Signed)
Addended by: [place] Cellar on: 10/07/2011 06:53 AM   Modules accepted: Orders

## 2011-10-07 NOTE — Telephone Encounter (Signed)
Patient with obvious PCN hypersensitivity, will send in Azithromycin as alternative.

## 2011-10-09 ENCOUNTER — Ambulatory Visit (INDEPENDENT_AMBULATORY_CARE_PROVIDER_SITE_OTHER): Payer: BC Managed Care – PPO | Admitting: Family Medicine

## 2011-10-09 ENCOUNTER — Encounter: Payer: Self-pay | Admitting: Family Medicine

## 2011-10-09 VITALS — BP 164/108 | HR 81 | Ht 64.0 in | Wt 195.0 lb

## 2011-10-09 DIAGNOSIS — J020 Streptococcal pharyngitis: Secondary | ICD-10-CM

## 2011-10-09 DIAGNOSIS — I10 Essential (primary) hypertension: Secondary | ICD-10-CM

## 2011-10-09 MED ORDER — LISINOPRIL 40 MG PO TABS: 40.0000 mg | ORAL_TABLET | Freq: Every day | ORAL | Status: DC

## 2011-10-09 NOTE — Patient Instructions (Signed)
Pick up the Azithromycin at the drug store.   I have sent in the lisinopril for you. Come and see me in 2 weeks so we can see how you're doing with the blood pressure medication.

## 2011-10-12 NOTE — Assessment & Plan Note (Signed)
Refilled lisinopril.  FU in 2 weeks for recheck BP

## 2011-10-12 NOTE — Progress Notes (Signed)
  Subjective:    Patient ID: Shelly Wheeler, female    DOB: 16-Aug-1945, 66 y.o.   MRN: [id number]  HPI  1.   Hypertension:  Long-term problem for this patient.  No adverse effects from medication.  Not checking it regularly.  No HA, CP, dizziness, shortness of breath, palpitations, or LE swelling.  Has been out of her lisinopril for several days.   BP Readings from Last 3 Encounters:  10/09/11 164/108  10/03/11 160/100  06/26/11 154/101    2.  Sore throat:  Persists.  Had rash and nausea with PCN.  Stopped taking.  Has not yet picked up the alternative azithromycin prescription.  NO cough or other URI symptoms.  No nausea, vomiting, abdominal pain.  No fevers or chills.   Review of Systems See HPI above for review of systems.       Objective:   Physical Exam  BP 164/108  Pulse 81  Ht 5\' 4"  (1.626 m)  Wt 195 lb (88.451 kg)  BMI 33.47 kg/m2 Gen:  Patient sitting on exam table, appears stated age in no acute distress Head: Normocephalic atraumatic Eyes: EOMI, PERRL, sclera and conjunctiva non-erythematous Nose:  Nares patent  Mouth: Mucosa membranes moist. Tonsils +3 and erythematous.  Neck: No cervical lymphadenopathy noted Heart:  RRR, no murmurs auscultated. Pulm:  Clear to auscultation bilaterally with good air movement.  No wheezes or rales noted.          Assessment & Plan:

## 2011-10-12 NOTE — Assessment & Plan Note (Signed)
Switched to Azithromycin.  Sent this in over the weekend, advised her to pick this up.   No worsening or signs of systemic infection. I provided the patient with explicit warnings and red flags that would prompt return to clinic or the ED.

## 2011-10-19 ENCOUNTER — Ambulatory Visit (INDEPENDENT_AMBULATORY_CARE_PROVIDER_SITE_OTHER): Payer: BC Managed Care – PPO | Admitting: General Surgery

## 2011-10-19 ENCOUNTER — Encounter (INDEPENDENT_AMBULATORY_CARE_PROVIDER_SITE_OTHER): Payer: Self-pay | Admitting: General Surgery

## 2011-10-19 ENCOUNTER — Ambulatory Visit
Admission: RE | Admit: 2011-10-19 | Discharge: 2011-10-19 | Disposition: A | Discharge status: Home / Self Care | Payer: BC Managed Care – PPO | Source: Ambulatory Visit | Attending: Family Medicine | Admitting: Family Medicine

## 2011-10-19 VITALS — BP 124/84 | HR 84 | Ht 64.0 in | Wt 195.2 lb

## 2011-10-19 DIAGNOSIS — N6489 Other specified disorders of breast: Secondary | ICD-10-CM
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
## 2011-11-07 ENCOUNTER — Encounter: Payer: Self-pay | Admitting: Family Medicine

## 2011-11-07 ENCOUNTER — Ambulatory Visit (INDEPENDENT_AMBULATORY_CARE_PROVIDER_SITE_OTHER): Payer: BC Managed Care – PPO | Admitting: Family Medicine

## 2011-11-07 DIAGNOSIS — E559 Vitamin D deficiency, unspecified: Secondary | ICD-10-CM | POA: Insufficient documentation

## 2011-11-07 DIAGNOSIS — I10 Essential (primary) hypertension: Secondary | ICD-10-CM

## 2011-11-07 DIAGNOSIS — N6489 Other specified disorders of breast: Secondary | ICD-10-CM

## 2011-11-07 MED ORDER — LISINOPRIL-HYDROCHLOROTHIAZIDE 20-25 MG PO TABS: 1.0000 | ORAL_TABLET | Freq: Every day | ORAL | Status: DC

## 2011-11-07 MED ORDER — VITAMIN D (ERGOCALCIFEROL) 1.25 MG (50000 UNIT) PO CAPS: 50000.0000 [IU] | ORAL_CAPSULE | ORAL | Status: DC

## 2011-11-07 NOTE — Progress Notes (Signed)
  Subjective:    Patient ID: Shelly Wheeler, female    DOB: Mar 14, 1946, 66 y.o.   MRN: [id number]  HPI Hypertension:  Long-term problem for this patient.  No adverse effects from medication.  Not checking it regularly.  No HA, CP, dizziness, shortness of breath, palpitations.  Some mild swelling today bilaterally.  Just started Zumba for exercise.  Did have questions about whether her blood pressure would limit her with Zumba.   BP Readings from Last 3 Encounters:  11/07/11 148/100  10/19/11 124/84  10/09/11 164/108    Vit D:  Had levels checked previously, found to be low in past.   Was never repleted in past due to financial issues.  She would like this be done now. Nutritionist also recommended she have Vitamin D level repleted secondary to fatigue.  Fatigue workup has otherwise been negative.    GERD:  Problems on most days.  Described as burning in restrosternal region.  No nausea/vomiting.  Relieved in past with Pepcid.  States she would like to retry Pepcid for relief.  No nausea, shortness of breath, chest pain on exertion.        Review of Systems See HPI above for review of systems.       Objective:   Physical Exam  Gen:  Alert, cooperative patient who appears stated age in no acute distress.  Vital signs reviewed. HEENT:  [place]/AT.  EOMI, PERRL.  MMM, tonsils non-erythematous, non-edematous.  External ears WNL, Bilateral TM's normal without retraction, redness or bulging.  Neck: No masses or thyromegaly or limitation in range of motion.  No cervical lymphadenopathy. Pulm:  Clear to auscultation bilaterally with good air movement.  No wheezes or rales noted.   Cardiac:  RRR with Grade II/VI SEM.   Abd:  Soft/nondistended/nontender.  Good bowel sounds throughout Ext:  + 1 edema BL LE's   Neuro:  Grossly normal, no gait abnormalities Psych:  Not depressed or anxious appearing.  Conversant and engaged       Assessment & Plan:

## 2011-11-07 NOTE — Patient Instructions (Signed)
Take the Vitamin D 1 pill a week for 12 weeks and then we'll recheck it.  I sent the combination pill to [facility] on [address].    The nurses will give you the number for Dr. Gerilyn Pilgrim.  Call the insurance company and ask about her and a nurse visit.  Set up a nurse visit to have your blood pressure checked in about 1 month.    Make a lab appt at your convenience.

## 2011-11-08 ENCOUNTER — Encounter: Payer: Self-pay | Admitting: Family Medicine

## 2011-11-08 NOTE — Assessment & Plan Note (Addendum)
Long discussion with patient as she has had chronic trouble with HTN.  Only time controlled was when she was on Lisinopril/HCTZ combination.   However she stopped HCTZ because physician on Dr. Neil Crouch television show stated that it could cause lip cancer.  Also doesn't want to restart Norvasc because of problems that she heard about on television regarding Norvasc. States that she has just started soon but believes this will bring down her blood pressure. She states she wants to continue the lisinopril. After discussion she said she'll try the lisinopril/hydrochlorothiazide combination for only one month at that point she will switch is of packed lisinopril. She'll like to have her blood pressure checked in one month after a month of December and the combination pill. She believes that at that point sent to the lisinopril but supple keep her blood pressure down. Discussion regarding risk of lip cancer versus risks of stroke and heart failure and that the risk of stroke or heart failure is much higher than lip cancer. Patient states she understands this but still wants to simply continue lisinopril. Followup in one month for blood pressure check
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
## 2011-11-08 NOTE — Assessment & Plan Note (Signed)
Replete with weekly 50000 units Vitamin D today.

## 2011-11-13 ENCOUNTER — Ambulatory Visit (INDEPENDENT_AMBULATORY_CARE_PROVIDER_SITE_OTHER): Payer: BC Managed Care – PPO | Admitting: Family Medicine

## 2011-11-13 ENCOUNTER — Telehealth: Payer: Self-pay | Admitting: *Deleted

## 2011-11-13 VITALS — BP 135/95 | HR 75 | Ht 64.0 in | Wt 198.2 lb

## 2011-11-13 DIAGNOSIS — R110 Nausea: Secondary | ICD-10-CM

## 2011-11-13 DIAGNOSIS — M542 Cervicalgia: Secondary | ICD-10-CM

## 2011-11-13 DIAGNOSIS — I10 Essential (primary) hypertension: Secondary | ICD-10-CM

## 2011-11-13 NOTE — Assessment & Plan Note (Signed)
Again, not likely secondary to re-initiation of HCTZ as she did not have problems with this previously. However it is possible the is secondary to HCTZ. Could also be possible to neck pain due to severity of neck pain.  Recommended symptomatic relief of neck pain with FU with me later this week by phone. If persists, can stop HCTZ and try another anti-hypertensive. If resolves, likely secondary to neck pain or some other transient etiology.

## 2011-11-13 NOTE — Progress Notes (Signed)
  Subjective:    Patient ID: COLLEENA KURTENBACH, female    DOB: September 07, 1945, 66 y.o.   MRN: [id number]  HPI  66 yo F who was restarted on combination of Lisinopril-HCTZ on Friday.  Starting on Saturday she began to experience headache that began to radiate to Left side of neck and then settled in Left parietal region.  Described some tightness in her neck at that time as well.  Took 1 Excedrin with relief.  Pain returned about 6 hours later and she did not want to take any further medications so she went to bed.  Pain has persisted since that time.  Today, 3 days after initiation of pain, had some nausea which resolved with ginger ale.  No vomiting.  No LE edema.  Pain has persisted.  She is concerned it is because of re-initiation of HCTZ.  No changes in hearing.  Has had some nausea which started today but no vomiting.  No lightheadedness or vertigo.     The following portions of the patient's history were reviewed and updated as appropriate: allergies, current medications, past medical history, family and social history, and problem list.  Patient is a nonsmoker.  Current Outpatient Prescriptions  Medication Sig Dispense Refill  . Cholecalciferol (VITAMIN D PO) Take by mouth.      Marland Kitchen lisinopril (PRINIVIL,ZESTRIL) 40 MG tablet       . lisinopril-hydrochlorothiazide (PRINZIDE,ZESTORETIC) 20-25 MG per tablet Take 1 tablet by mouth daily.  30 tablet  1  . Vitamin D, Ergocalciferol, (DRISDOL) 50000 UNITS CAPS Take 1 capsule (50,000 Units total) by mouth every 7 (seven) days.  12 capsule  0     Review of Systems See HPI above for review of systems.       Objective:   Physical Exam  Gen:  Alert, cooperative patient who appears stated age in no acute distress.  Vital signs reviewed. HEENT:  [place]/AT.  EOMI, PERRL.  MMM, tonsils non-erythematous, non-edematous.  External ears WNL, Bilateral TM's normal without retraction, redness or bulging but with moderate amount of cerumen noted. Neck:  Palpable spasm  noted Left trapezius muscle.  Full flexion, extension, lateral movement noted with pain or tenderness Cardiac:  Regular rate and rhythm without murmur auscultated.  Good S1/S2. Lungs:  Clear to auscultation bilaterally.  No wheezing, rales, rhonchi noted.   Abdomen:  Soft/nondistended/nontender Ext:  No edema noted.       Assessment & Plan:

## 2011-11-13 NOTE — Assessment & Plan Note (Signed)
Likely secondary to neck spasm.  Treat with anti-inflammatories, muscle stretching exercises, heat, massage. FU with me later this week, I will be out of town starting next week and unable to communicate with her at that time.   Not likely this is side effect of HCTZ.

## 2011-11-13 NOTE — Patient Instructions (Signed)
Follow-up with me later this week.   Keep on the HCTZ for a couple more days. Do some of the neck exercises we discussed. If your nausea gets worse, stop the HCTZ and call me or come in and be seen.

## 2011-11-13 NOTE — Telephone Encounter (Signed)
Patient calls stating she started lisinopril/ HCTZ  on 04/20. That day she  she started experiencing left face and neck pain into left shoulder. She also had headache Sat and Sunday.  Today continues with symptoms and also had some left arm discomfort today. States in the past she was on the combination lisinopril and HCTZ and experienced similar symptoms . This was stopped in Sept 2012 and she continued on lisinopril. Just restarted combination pill on Sat 04/20. Consulted with Dr. [place] and she advises should come in today for evaluation just to make sure  problem is due to medication .

## 2011-11-13 NOTE — Assessment & Plan Note (Signed)
  At goal today, since restarting Lisinopril/HCTZ combo.
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
## 2012-03-28 ENCOUNTER — Ambulatory Visit (INDEPENDENT_AMBULATORY_CARE_PROVIDER_SITE_OTHER): Payer: BC Managed Care – PPO | Admitting: Family Medicine

## 2012-03-28 ENCOUNTER — Encounter: Payer: Self-pay | Admitting: Family Medicine

## 2012-03-28 VITALS — BP 148/98 | HR 84 | Temp 98.7°F | Ht 64.0 in | Wt 197.2 lb

## 2012-03-28 DIAGNOSIS — M79609 Pain in unspecified limb: Secondary | ICD-10-CM

## 2012-03-28 DIAGNOSIS — M79644 Pain in right finger(s): Secondary | ICD-10-CM | POA: Insufficient documentation

## 2012-03-28 DIAGNOSIS — M79646 Pain in unspecified finger(s): Secondary | ICD-10-CM

## 2012-03-28 DIAGNOSIS — I10 Essential (primary) hypertension: Secondary | ICD-10-CM

## 2012-03-28 MED ORDER — CHLORTHALIDONE 25 MG PO TABS: 25.0000 mg | ORAL_TABLET | Freq: Every day | ORAL | Status: DC

## 2012-03-28 MED ORDER — LISINOPRIL 20 MG PO TABS: 20.0000 mg | ORAL_TABLET | Freq: Every day | ORAL | Status: AC

## 2012-03-28 NOTE — Progress Notes (Signed)
  Subjective:    Patient ID: Shelly Wheeler, female    DOB: 01/20/1946, 66 y.o.   MRN: [id number]  HPI  1.  Hypertension:  Long-term problem for this patient.  No adverse effects from medication.  Not checking it regularly.  No HA, CP, dizziness, shortness of breath, palpitations, or LE swelling.  Has been out of her blood pressure medication for about 2 weeks.   BP Readings from Last 3 Encounters:  03/28/12 148/98  11/13/11 135/95  11/07/11 148/100   2.  Right thumb pain:  Present for about 1 month.  No falls, no trauma to area.  Occasionally has swelling there.  Now very stiff, painful if she tries to flex it.  No redness or heat to the area.  Pain more on lateral and ventral aspect of thumb.  Does not text with thumbs.    3..  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  Plans to move to [place] to be with family on October 13.  Feels much more relaxed and less stressed now that she is done.    Review of Systems See HPI above for review of systems.       Objective:   Physical Exam Gen:  Alert, cooperative patient who appears stated age in no acute distress.  Vital signs reviewed. HEENT:  [place]/AT.  EOMI, PERRL.  MMM, tonsils non-erythematous, non-edematous.  External ears WNL, Bilateral TM's normal without retraction, redness or bulging.  Cardiac:  Regular rate and rhythm without murmur auscultated.  Good S1/S2. Pulm:  Clear to auscultation bilaterally with good air movement.  No wheezes or rales noted.   MSK:  Left thumb WNL.  Right thumb with some increased swelling at MCP joint and PIP joint.  Decreased flexion and extension of thumb.  No redness or heat to palpation.  Tender to palpation along MCP joint, mostly on ventral side.  No pain over snuffbox or over pollicus longus tendon      Assessment & Plan:

## 2012-03-28 NOTE — Patient Instructions (Signed)
Make an appointment on your way out for Sports Medicine. The new medicine will be the same Lisinopril as before and the new medicine is called Chlorthalidone.   Make an appt to see me before you move  It was good to see you today!

## 2012-03-29 DIAGNOSIS — M79646 Pain in unspecified finger(s): Secondary | ICD-10-CM | POA: Insufficient documentation

## 2012-03-29 NOTE — Assessment & Plan Note (Signed)
Does not seem to be de Quervain's.  Likely strain with recent packing up of house in preparation to move, has been lifting heavier than usual bags and such. Ice and NSAIDs for relief. Referral to Sports Med to see if they have any further recommendations, also to see if there is any need for imaging.

## 2012-03-29 NOTE — Assessment & Plan Note (Signed)
Changing to chlorthalidone for longer-acting anti-hypertensive use.   She still has some concerns for HCTZ and risk of lip cancer, another reason for switch.

## 2012-04-22 ENCOUNTER — Ambulatory Visit: Payer: BC Managed Care – PPO | Admitting: Sports Medicine

## 2012-04-29 ENCOUNTER — Ambulatory Visit: Payer: BC Managed Care – PPO | Admitting: Family Medicine

## 2012-05-01 ENCOUNTER — Encounter: Payer: Self-pay | Admitting: Family Medicine

## 2012-05-01 ENCOUNTER — Ambulatory Visit (INDEPENDENT_AMBULATORY_CARE_PROVIDER_SITE_OTHER): Payer: BC Managed Care – PPO | Admitting: Family Medicine

## 2012-05-01 VITALS — BP 147/85 | HR 91 | Temp 98.1°F | Wt 201.6 lb

## 2012-05-01 DIAGNOSIS — Z23 Encounter for immunization: Secondary | ICD-10-CM

## 2012-05-01 DIAGNOSIS — R7309 Other abnormal glucose: Secondary | ICD-10-CM

## 2012-05-01 DIAGNOSIS — R739 Hyperglycemia, unspecified: Secondary | ICD-10-CM

## 2012-05-01 DIAGNOSIS — I10 Essential (primary) hypertension: Secondary | ICD-10-CM

## 2012-05-01 LAB — BASIC METABOLIC PANEL
BUN: 12 mg/dL (ref 6–23)
CO2: 30 mEq/L (ref 19–32)
Calcium: 9.5 mg/dL (ref 8.4–10.5)
Creat: 0.73 mg/dL (ref 0.50–1.10)
Glucose, Bld: 94 mg/dL (ref 70–99)

## 2012-05-01 NOTE — Patient Instructions (Signed)
We are going to check some labs today since it's been about a year.    Otherwise, it's been very good to take care of you!  I hope you have a good move to [place].  Come back and say hello when you visit.

## 2012-05-02 NOTE — Progress Notes (Signed)
  Subjective:    Patient ID: Shelly Wheeler, female    DOB: 1946-04-12, 66 y.o.   MRN: [id number]  HPI  1.   Hypertension:  Patient here to follow-up for chronic hypertension. She is moving on Monday and states that her pressure has been creeping up since then.  She is taking Lisionpril and Chlorthalidone.  No adverse effects from medication.  Not checking it regularly.  No HA, CP, dizziness, shortness of breath, palpitations, or LE swelling.   BP Readings from Last 3 Encounters:  05/01/12 147/85  03/28/12 148/98  11/13/11 135/95     Review of Systems See HPI above for review of systems.       Objective:   Physical Exam Gen:  Alert, cooperative patient who appears stated age in no acute distress.  Vital signs reviewed. Cardiac:  Regular rate and rhythm without murmur auscultated.  Good S1/S2. Lungs:  Clear to auscultation bilaterally.  No wheezing, rales, rhonchi noted.   Ext:  No clubbing/cyanosis/erythema.  No edema noted bilateral lower extremities.          Assessment & Plan:

## 2012-05-02 NOTE — Assessment & Plan Note (Signed)
Patient has been on multiple meds in past and is not always forthcoming as to when she is or is not taking her medications.   States she is taking her anti-HTN meds currently.   No changes to her meds today.  She is moving on Monday and I do not want to make any changes without being able to follow up on this.  Checking creatinine as off and on Lisinopril in past several months.   FU with [place] when she returns to check on her mother who still lives in town.  She expects to come back once a month.

## 2012-06-18 ENCOUNTER — Ambulatory Visit: Payer: BC Managed Care – PPO

## 2012-06-27 ENCOUNTER — Other Ambulatory Visit: Payer: Self-pay | Admitting: Sports Medicine

## 2012-10-10 ENCOUNTER — Other Ambulatory Visit: Payer: Self-pay

## 2012-10-10 DIAGNOSIS — Z1231 Encounter for screening mammogram for malignant neoplasm of breast: Secondary | ICD-10-CM

## 2012-10-24 ENCOUNTER — Ambulatory Visit (INDEPENDENT_AMBULATORY_CARE_PROVIDER_SITE_OTHER): Payer: BC Managed Care – PPO | Admitting: General Surgery

## 2012-11-26 ENCOUNTER — Ambulatory Visit (INDEPENDENT_AMBULATORY_CARE_PROVIDER_SITE_OTHER): Payer: BC Managed Care – PPO | Admitting: General Surgery

## 2012-11-26 ENCOUNTER — Ambulatory Visit: Payer: BC Managed Care – PPO

## 2013-02-11 ENCOUNTER — Ambulatory Visit (INDEPENDENT_AMBULATORY_CARE_PROVIDER_SITE_OTHER): Payer: Medicare Other | Admitting: General Surgery

## 2013-02-11 ENCOUNTER — Ambulatory Visit
Admission: RE | Admit: 2013-02-11 | Discharge: 2013-02-11 | Disposition: A | Discharge status: Home / Self Care | Payer: BC Managed Care – PPO | Source: Ambulatory Visit

## 2013-02-11 ENCOUNTER — Encounter (INDEPENDENT_AMBULATORY_CARE_PROVIDER_SITE_OTHER): Payer: Self-pay | Admitting: General Surgery

## 2013-02-11 VITALS — BP 150/92 | HR 78 | Resp 16 | Ht 64.0 in | Wt 205.8 lb

## 2013-02-11 DIAGNOSIS — Z1231 Encounter for screening mammogram for malignant neoplasm of breast: Secondary | ICD-10-CM

## 2013-02-11 DIAGNOSIS — N6489 Other specified disorders of breast: Secondary | ICD-10-CM
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
## 2014-01-14 ENCOUNTER — Other Ambulatory Visit: Payer: Self-pay

## 2014-01-14 DIAGNOSIS — Z1231 Encounter for screening mammogram for malignant neoplasm of breast: Secondary | ICD-10-CM

## 2014-02-12 ENCOUNTER — Encounter (INDEPENDENT_AMBULATORY_CARE_PROVIDER_SITE_OTHER): Payer: Self-pay

## 2014-02-12 ENCOUNTER — Ambulatory Visit (INDEPENDENT_AMBULATORY_CARE_PROVIDER_SITE_OTHER): Payer: Medicare Other | Admitting: General Surgery

## 2014-02-12 ENCOUNTER — Encounter (INDEPENDENT_AMBULATORY_CARE_PROVIDER_SITE_OTHER): Payer: Self-pay | Admitting: General Surgery

## 2014-02-12 ENCOUNTER — Ambulatory Visit
Admission: RE | Admit: 2014-02-12 | Discharge: 2014-02-12 | Disposition: A | Discharge status: Home / Self Care | Payer: Medicare Other | Source: Ambulatory Visit

## 2014-02-12 VITALS — BP 120/72 | HR 76 | Temp 98.0°F | Ht 64.0 in | Wt 209.0 lb

## 2014-02-12 DIAGNOSIS — Z1231 Encounter for screening mammogram for malignant neoplasm of breast: Secondary | ICD-10-CM

## 2014-02-12 DIAGNOSIS — N6489 Other specified disorders of breast: Secondary | ICD-10-CM
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
## 2014-12-22 ENCOUNTER — Other Ambulatory Visit: Payer: Self-pay | Admitting: General Surgery

## 2014-12-22 ENCOUNTER — Other Ambulatory Visit: Payer: Self-pay

## 2014-12-23 ENCOUNTER — Other Ambulatory Visit: Payer: Self-pay

## 2014-12-23 DIAGNOSIS — N644 Mastodynia: Secondary | ICD-10-CM

## 2014-12-23 DIAGNOSIS — N6489 Other specified disorders of breast: Secondary | ICD-10-CM

## 2015-01-19 ENCOUNTER — Other Ambulatory Visit: Payer: Self-pay

## 2015-02-23 ENCOUNTER — Ambulatory Visit
Admission: RE | Admit: 2015-02-23 | Discharge: 2015-02-23 | Disposition: A | Discharge status: Home / Self Care | Payer: Medicare Other | Source: Ambulatory Visit | Attending: General Surgery | Admitting: General Surgery

## 2015-02-23 ENCOUNTER — Ambulatory Visit: Admission: RE | Admit: 2015-02-23 | Payer: Medicare Other | Source: Ambulatory Visit

## 2016-01-13 ENCOUNTER — Other Ambulatory Visit: Payer: Self-pay | Admitting: General Surgery

## 2016-01-13 DIAGNOSIS — Z1231 Encounter for screening mammogram for malignant neoplasm of breast: Secondary | ICD-10-CM

## 2016-02-28 ENCOUNTER — Ambulatory Visit
Admission: RE | Admit: 2016-02-28 | Discharge: 2016-02-28 | Disposition: A | Discharge status: Home / Self Care | Payer: Medicare Other | Source: Ambulatory Visit | Attending: General Surgery | Admitting: General Surgery

## 2016-02-28 ENCOUNTER — Ambulatory Visit: Payer: Medicare Other

## 2016-02-28 DIAGNOSIS — Z1231 Encounter for screening mammogram for malignant neoplasm of breast: Secondary | ICD-10-CM

## 2017-06-11 ENCOUNTER — Other Ambulatory Visit: Payer: Self-pay | Admitting: General Surgery

## 2017-06-11 DIAGNOSIS — Z1231 Encounter for screening mammogram for malignant neoplasm of breast: Secondary | ICD-10-CM

## 2017-06-12 ENCOUNTER — Ambulatory Visit
Admission: RE | Admit: 2017-06-12 | Discharge: 2017-06-12 | Disposition: A | Discharge status: Home / Self Care | Payer: Medicare Other | Source: Ambulatory Visit | Attending: General Surgery | Admitting: General Surgery

## 2017-06-12 ENCOUNTER — Other Ambulatory Visit: Payer: Self-pay | Admitting: General Surgery

## 2017-06-12 DIAGNOSIS — Z1231 Encounter for screening mammogram for malignant neoplasm of breast: Secondary | ICD-10-CM

## 2017-06-12 DIAGNOSIS — N63 Unspecified lump in unspecified breast: Secondary | ICD-10-CM

## 2017-06-30 ENCOUNTER — Other Ambulatory Visit: Payer: Self-pay | Admitting: General Surgery

## 2017-06-30 DIAGNOSIS — N63 Unspecified lump in unspecified breast: Secondary | ICD-10-CM
